# Patient Record
Sex: Male | Born: 1985 | Race: White | Hispanic: No | Marital: Single | Smoking: Current every day smoker
Health system: Southern US, Community
[De-identification: ages and names within clinical notes are randomized; demographics above are authoritative.]

## PROBLEM LIST (undated history)

## (undated) DIAGNOSIS — F32A Depression, unspecified: Secondary | ICD-10-CM

## (undated) DIAGNOSIS — F988 Other specified behavioral and emotional disorders with onset usually occurring in childhood and adolescence: Secondary | ICD-10-CM

## (undated) DIAGNOSIS — F1910 Other psychoactive substance abuse, uncomplicated: Secondary | ICD-10-CM

## (undated) DIAGNOSIS — F419 Anxiety disorder, unspecified: Secondary | ICD-10-CM

## (undated) DIAGNOSIS — T7840XA Allergy, unspecified, initial encounter: Secondary | ICD-10-CM

## (undated) DIAGNOSIS — G4730 Sleep apnea, unspecified: Secondary | ICD-10-CM

## (undated) DIAGNOSIS — I10 Essential (primary) hypertension: Secondary | ICD-10-CM

## (undated) HISTORY — DX: Essential (primary) hypertension: I10

## (undated) HISTORY — PX: WISDOM TOOTH EXTRACTION: SHX21

## (undated) HISTORY — DX: Other psychoactive substance abuse, uncomplicated: F19.10

## (undated) HISTORY — DX: Other specified behavioral and emotional disorders with onset usually occurring in childhood and adolescence: F98.8

## (undated) HISTORY — DX: Sleep apnea, unspecified: G47.30

## (undated) HISTORY — PX: TONSILLECTOMY: SUR1361

## (undated) HISTORY — DX: Anxiety disorder, unspecified: F41.9

## (undated) HISTORY — DX: Depression, unspecified: F32.A

## (undated) HISTORY — PX: TYMPANOSTOMY TUBE PLACEMENT: SHX32

## (undated) HISTORY — DX: Allergy, unspecified, initial encounter: T78.40XA

---
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
## 2012-08-05 DIAGNOSIS — F429 Obsessive-compulsive disorder, unspecified: Secondary | ICD-10-CM | POA: Insufficient documentation

## 2014-12-27 ENCOUNTER — Encounter (HOSPITAL_COMMUNITY): Payer: Self-pay | Admitting: Emergency Medicine

## 2014-12-27 ENCOUNTER — Emergency Department (HOSPITAL_COMMUNITY)
Admission: EM | Admit: 2014-12-27 | Discharge: 2014-12-27 | Disposition: A | Discharge status: Home / Self Care | Payer: 59 | Attending: Emergency Medicine | Admitting: Emergency Medicine

## 2014-12-27 DIAGNOSIS — Z720 Tobacco use: Secondary | ICD-10-CM | POA: Insufficient documentation

## 2014-12-27 DIAGNOSIS — K644 Residual hemorrhoidal skin tags: Secondary | ICD-10-CM | POA: Insufficient documentation

## 2014-12-27 DIAGNOSIS — K6289 Other specified diseases of anus and rectum: Secondary | ICD-10-CM | POA: Insufficient documentation

## 2014-12-27 DIAGNOSIS — Z880 Allergy status to penicillin: Secondary | ICD-10-CM | POA: Diagnosis not present

## 2014-12-27 DIAGNOSIS — N4889 Other specified disorders of penis: Secondary | ICD-10-CM | POA: Insufficient documentation

## 2014-12-27 DIAGNOSIS — K648 Other hemorrhoids: Secondary | ICD-10-CM | POA: Insufficient documentation

## 2014-12-27 LAB — URINE MICROSCOPIC-ADD ON

## 2014-12-27 LAB — URINALYSIS, ROUTINE W REFLEX MICROSCOPIC
Glucose, UA: NEGATIVE mg/dL
Hgb urine dipstick: NEGATIVE
KETONES UR: 15 mg/dL — AB
LEUKOCYTES UA: NEGATIVE
NITRITE: NEGATIVE
PH: 6 (ref 5.0–8.0)
Protein, ur: 30 mg/dL — AB
Specific Gravity, Urine: 1.039 — ABNORMAL HIGH (ref 1.005–1.030)
Urobilinogen, UA: 1 mg/dL (ref 0.0–1.0)

## 2014-12-27 LAB — POC OCCULT BLOOD, ED: FECAL OCCULT BLD: NEGATIVE

## 2014-12-27 MED ORDER — DIBUCAINE 1 % EX OINT: TOPICAL_OINTMENT | Freq: Three times a day (TID) | CUTANEOUS | Status: DC | PRN

## 2014-12-27 MED ORDER — DOCUSATE SODIUM 100 MG PO CAPS: 100.0000 mg | ORAL_CAPSULE | Freq: Two times a day (BID) | ORAL | Status: DC | PRN

## 2014-12-27 NOTE — ED Provider Notes (Signed)
CSN: [id number]     Arrival date & time 12/27/14  1642 History  This chart was scribed for non-physician practitioner, Trixie Dredge, PA working with Alvira Monday, MD by Gwenyth Ober, ED scribe. This patient was seen in room TR03C/TR03C and the patient's care was started at 5:24 PM   Chief Complaint  Patient presents with  . Rectal Pain   The history is provided by the patient. No language interpreter was used.   HPI Comments: Patrick Alexander is a 29 y.o. male with no chronic medical history who presents to the Emergency Department complaining of constant, gradually worsening rectal pain that radiates to his "prostate and urethra" and started 2 weeks ago. He states a visible external hemorrhoid, mild light-headedness secondary to pain, occasional blood in his stool, swelling of his inguinal lymph nodes and incomplete emptying of his bladder as associated symptoms. Pt describes recent BMs as hard and small, very painful to pass. He last saw blood in his stool 3-4 days ago. Pt's pain becomes worse with bowel movements and straining to urinate. He has tried 2 Ibuprofen, Preparation H and hemorrhoid suppositories PTA with no relief. Pt reports onset of pain started 1 week after he was doing repetitive, heavy lifting. He describes initial symptoms as discomfort during BM, but states pain has worsened and now radiates to his urethra. His external hemorrhoid has tripled in size within the last few days. Pt denies history of prostatitis. He is not currently sexually active, and states with his few sexual encounters over the years he has always used protection. Pt denies a history of abdominal or pelvic surgeries. He also denies fever, chills, generalized body aches, abdominal pain, testicular pain, testicular swelling and penile discharge as associated symptoms.  History reviewed. No pertinent past medical history. Past Surgical History  Procedure Laterality Date  . Tonsillectomy     No family history on  file. Social History  Substance Use Topics  . Smoking status: Current Every Day Smoker -- 0.50 packs/day    Types: Cigarettes  . Smokeless tobacco: None  . Alcohol Use: Yes     Comment: social    Review of Systems  Constitutional: Negative for fever and chills.  Gastrointestinal: Positive for blood in stool, anal bleeding and rectal pain. Negative for abdominal pain.  Genitourinary: Positive for difficulty urinating and penile pain. Negative for discharge, scrotal swelling and testicular pain.  Musculoskeletal: Negative for myalgias.  Allergic/Immunologic: Negative for immunocompromised state.  Neurological: Positive for light-headedness.  Hematological: Does not bruise/bleed easily.  All other systems reviewed and are negative.  Allergies  Penicillins  Home Medications   Prior to Admission medications   Not on File   BP 144/93 mmHg  Pulse 98  Temp(Src) 98 F (36.7 C) (Oral)  Resp 16  Wt 166 lb (75.297 kg)  SpO2 98% Physical Exam  Constitutional: He appears well-developed and well-nourished. No distress.  HENT:  Head: Normocephalic and atraumatic.  Neck: Neck supple.  Cardiovascular: Normal rate and regular rhythm.   Pulmonary/Chest: Effort normal and breath sounds normal. No respiratory distress. He has no wheezes. He has no rales.  Abdominal: Soft. He exhibits no distension and no mass. There is no tenderness. There is no rebound and no guarding.  Genitourinary:  Chaperone (scribe) was present for exam which was performed with no discomfort or complications.  Single external hemorrhoid, soft, non-tender at 6 o'clock Multiple internal hemorrhoids  Prostate non-tender No other masses noted No focal tenderness or induration  Neurological: He is alert.  He exhibits normal muscle tone.  Skin: He is not diaphoretic.  Nursing note and vitals reviewed.   ED Course  Procedures   DIAGNOSTIC STUDIES: Oxygen Saturation is 98% on RA, normal by my interpretation.     COORDINATION OF CARE: 5:48 PM Discussed treatment plan with pt at bedside and pt agreed to plan.  7:29 PM Discussed lab results with pt and suspicion for pain secondary to hemorrhoids. Advised pt to return with worsening symptoms including fever, abdominal pain, vomiting, scrotal pain and dysuria. Will prescribe stool softener and refer pt to a general surgeon. Pt agreed to plan.  Labs Review Labs Reviewed  URINALYSIS, ROUTINE W REFLEX MICROSCOPIC (NOT AT [facility]) - Abnormal; Notable for the following:    Color, Urine AMBER (*)    APPearance CLOUDY (*)    Specific Gravity, Urine 1.039 (*)    Bilirubin Urine SMALL (*)    Ketones, ur 15 (*)    Protein, ur 30 (*)    All other components within normal limits  URINE MICROSCOPIC-ADD ON - Abnormal; Notable for the following:    Squamous Epithelial / LPF FEW (*)    All other components within normal limits  POC OCCULT BLOOD, ED    Imaging Review No results found.    EKG Interpretation None       Discussed pt with Dr Dalene Seltzer.   MDM   Final diagnoses:  Anal or rectal pain  Internal hemorrhoids  External hemorrhoid    Afebrile, nontoxic patient with anal/rectal pain and new internal and external hemorrhoids.  The hemorrhoids are not thrombosed.  His stool is hemoccult negative.  He has no fever or systemic symptoms, no dysuria, no prostate tenderness, testicular pain or swelling.  Doubt prostatitis, epididymitis, UTI, perirectal abscess, proctitis.   D/C home with dibucaine ointment, colace, general surgery referral.  Discussed return precautions.  Discussed result, findings, treatment, and follow up  with patient.  Pt given return precautions.  Pt verbalizes understanding and agrees with plan.      I personally performed the services described in this documentation, which was scribed in my presence. The recorded information has been reviewed and is accurate.   Trixie Dredge, PA-C 12/27/14 2056  Alvira Monday, MD 12/31/14  2148

## 2014-12-27 NOTE — ED Notes (Signed)
Pt from home for eval of rectal pain x1 week, pt states recently has been doing  A lot of heavy lifting and has noticed pain when having a bowel movement. Pt denies hx of hemorrhoids  And has tried OTC meds with no relief. Pt states 2 episodes last week where he noticed blood on toilet tissue but no episodes since. nad noted. Denies any n/v/d.

## 2014-12-27 NOTE — Discharge Instructions (Signed)
Read the information below.  Use the prescribed medication as directed.  Please discuss all new medications with your pharmacist.  You may return to the Emergency Department at any time for worsening condition or any new symptoms that concern you.   If you develop fevers, uncontrolled pain, pain or burning with urination, testicular pain or swelling, uncontrolled bleeding, or vomiting, return to the ER immediately for a recheck.    Hemorrhoids Hemorrhoids are swollen veins around the rectum or anus. There are two types of hemorrhoids:   Internal hemorrhoids. These occur in the veins just inside the rectum. They may poke through to the outside and become irritated and painful.  External hemorrhoids. These occur in the veins outside the anus and can be felt as a painful swelling or hard lump near the anus. CAUSES  Pregnancy.   Obesity.   Constipation or diarrhea.   Straining to have a bowel movement.   Sitting for long periods on the toilet.  Heavy lifting or other activity that caused you to strain.  Anal intercourse. SYMPTOMS   Pain.   Anal itching or irritation.   Rectal bleeding.   Fecal leakage.   Anal swelling.   One or more lumps around the anus.  DIAGNOSIS  Your caregiver may be able to diagnose hemorrhoids by visual examination. Other examinations or tests that may be performed include:   Examination of the rectal area with a gloved hand (digital rectal exam).   Examination of anal canal using a small tube (scope).   A blood test if you have lost a significant amount of blood.  A test to look inside the colon (sigmoidoscopy or colonoscopy). TREATMENT Most hemorrhoids can be treated at home. However, if symptoms do not seem to be getting better or if you have a lot of rectal bleeding, your caregiver may perform a procedure to help make the hemorrhoids get smaller or remove them completely. Possible treatments include:   Placing a rubber band at the  base of the hemorrhoid to cut off the circulation (rubber band ligation).   Injecting a chemical to shrink the hemorrhoid (sclerotherapy).   Using a tool to burn the hemorrhoid (infrared light therapy).   Surgically removing the hemorrhoid (hemorrhoidectomy).   Stapling the hemorrhoid to block blood flow to the tissue (hemorrhoid stapling).  HOME CARE INSTRUCTIONS   Eat foods with fiber, such as whole grains, beans, nuts, fruits, and vegetables. Ask your doctor about taking products with added fiber in them (fibersupplements).  Increase fluid intake. Drink enough water and fluids to keep your urine clear or pale yellow.   Exercise regularly.   Go to the bathroom when you have the urge to have a bowel movement. Do not wait.   Avoid straining to have bowel movements.   Keep the anal area dry and clean. Use wet toilet paper or moist towelettes after a bowel movement.   Medicated creams and suppositories may be used or applied as directed.   Only take over-the-counter or prescription medicines as directed by your caregiver.   Take warm sitz baths for 15-20 minutes, 3-4 times a day to ease pain and discomfort.   Place ice packs on the hemorrhoids if they are tender and swollen. Using ice packs between sitz baths may be helpful.   Put ice in a plastic bag.   Place a towel between your skin and the bag.   Leave the ice on for 15-20 minutes, 3-4 times a day.   Do not use a  donut-shaped pillow or sit on the toilet for long periods. This increases blood pooling and pain.  SEEK MEDICAL CARE IF:  You have increasing pain and swelling that is not controlled by treatment or medicine.  You have uncontrolled bleeding.  You have difficulty or you are unable to have a bowel movement.  You have pain or inflammation outside the area of the hemorrhoids. MAKE SURE YOU:  Understand these instructions.  Will watch your condition.  Will get help right away if you are  not doing well or get worse. Document Released: 04/02/2000 Document Revised: 03/22/2012 Document Reviewed: 02/08/2012 [facility] Patient Information 2015 [place], [place]. This information is not intended to replace advice given to you by your health care provider. Make sure you discuss any questions you have with your health care provider.

## 2015-02-03 ENCOUNTER — Ambulatory Visit (INDEPENDENT_AMBULATORY_CARE_PROVIDER_SITE_OTHER): Payer: 59 | Admitting: Family Medicine

## 2015-02-03 ENCOUNTER — Encounter: Payer: Self-pay | Admitting: Family Medicine

## 2015-02-03 VITALS — BP 140/90 | HR 68 | Ht 67.0 in | Wt 166.2 lb

## 2015-02-03 DIAGNOSIS — J019 Acute sinusitis, unspecified: Secondary | ICD-10-CM | POA: Diagnosis not present

## 2015-02-03 DIAGNOSIS — R05 Cough: Secondary | ICD-10-CM | POA: Diagnosis not present

## 2015-02-03 DIAGNOSIS — Z7189 Other specified counseling: Secondary | ICD-10-CM

## 2015-02-03 DIAGNOSIS — K649 Unspecified hemorrhoids: Secondary | ICD-10-CM | POA: Insufficient documentation

## 2015-02-03 DIAGNOSIS — Z7251 High risk heterosexual behavior: Secondary | ICD-10-CM | POA: Insufficient documentation

## 2015-02-03 DIAGNOSIS — Z7689 Persons encountering health services in other specified circumstances: Secondary | ICD-10-CM

## 2015-02-03 DIAGNOSIS — Z87891 Personal history of nicotine dependence: Secondary | ICD-10-CM

## 2015-02-03 DIAGNOSIS — R059 Cough, unspecified: Secondary | ICD-10-CM

## 2015-02-03 HISTORY — DX: Unspecified hemorrhoids: K64.9

## 2015-02-03 HISTORY — DX: High risk heterosexual behavior: Z72.51

## 2015-02-03 MED ORDER — BENZONATATE 200 MG PO CAPS
200.0000 mg | ORAL_CAPSULE | Freq: Two times a day (BID) | ORAL | Status: DC | PRN
Start: 2015-02-03 — End: 2015-03-18

## 2015-02-03 MED ORDER — AZITHROMYCIN 250 MG PO TABS: ORAL_TABLET | ORAL | Status: DC

## 2015-02-03 NOTE — Progress Notes (Signed)
   Subjective:    Patient ID: Patrick Alexander, male    DOB: 01/26/1986, 29 y.o.   MRN: [id number]  HPI He is new to the practice and here to establish primary care. Complains of dry cough for 7 days that turned into a productive cough today. Also complains of sinus pressure, left ear ache and significant drainage,  body aches, and  3-4 nights ago he states he had difficulty breathing. States he took ibuprofen 800mg  and hot shower and got relief. Denies fever, chills, nausea, vomiting. Has tried Mucinex and Tylenol cold and flu. Reports feeling worse. Quit smoking last week. No known sick contacts. States he thinks he has some underlying allergies also.  Alcohol use 1-2 per week. Denies drug use.   Tonsillectomy in past.  History of bronchitis. Also states he was diagnosed with ADD in past and took Adderall for about a year or so but no longer needs it. States he is able to focus better since moving here. He lived in [place] previously.  States he is in a same sex relationship.  Works at [employer], is also IT trainerlicensed cosmetologist  Does not want flu shot.  Reviewed allergies, medications, past medical, surgical, family and social history.   Review of Systems Pertinent positives and negatives in HPI.     Objective:   Physical Exam Alert and in no distress. Left maxillary sinus tenderness. Tympanic membranes and canals are normal. Pharyngeal area is normal. Neck is supple without adenopathy but tenderness to tonsillar lymph nodes. Cardiac exam shows a regular rate and rhythm without murmurs or gallops. Lungs are clear to auscultation.    BP 140/90 mmHg  Pulse 68  Ht 5\' 7"  (1.702 m)  Wt 166 lb 3.2 oz (75.388 kg)  BMI 26.02 kg/m2     Assessment & Plan:  Cough - Plan: benzonatate (TESSALON) 200 MG capsule, azithromycin (ZITHROMAX Z-PAK) 250 MG tablet  Acute rhinosinusitis - Plan: azithromycin (ZITHROMAX Z-PAK) 250 MG tablet  Encounter to establish care  Former smoker  Discussed  symptomatic treatment and antibiotic therapy. Samples of Zyrtec given. Recommend staying hydrated and taking ibuprofen or tylenol for body aches. He will let me know if not back to baseline after completing antibiotic. Congratulated him on stopping smoking.  Refused flu shot.

## 2015-02-28 ENCOUNTER — Ambulatory Visit (INDEPENDENT_AMBULATORY_CARE_PROVIDER_SITE_OTHER): Payer: 59 | Admitting: Family Medicine

## 2015-02-28 ENCOUNTER — Encounter: Payer: Self-pay | Admitting: Family Medicine

## 2015-02-28 VITALS — BP 110/54 | HR 90 | Wt 161.0 lb

## 2015-02-28 DIAGNOSIS — R221 Localized swelling, mass and lump, neck: Secondary | ICD-10-CM

## 2015-02-28 DIAGNOSIS — Z720 Tobacco use: Secondary | ICD-10-CM

## 2015-02-28 DIAGNOSIS — L42 Pityriasis rosea: Secondary | ICD-10-CM | POA: Diagnosis not present

## 2015-02-28 DIAGNOSIS — F17200 Nicotine dependence, unspecified, uncomplicated: Secondary | ICD-10-CM

## 2015-02-28 NOTE — Patient Instructions (Signed)
  You can take Benadryl if itching from rash. It will go away in a few weeks. Not contagious and nothing to worry about.   Pityriasis Rosea Pityriasis rosea is a rash that usually appears on the trunk of the body. It may also appear on the upper arms and upper legs. It usually begins as a single patch, and then more patches begin to develop. The rash may cause mild itching, but it normally does not cause other problems. It usually goes away without treatment. However, it may take weeks or months for the rash to go away completely. CAUSES The cause of this condition is not known. The condition does not spread from person to person (is noncontagious). RISK FACTORS This condition is more likely to develop in young adults and children. It is most common in the spring and fall. SYMPTOMS The main symptom of this condition is a rash.  The rash usually begins with a single oval patch that is larger than the ones that follow. This is called a herald patch. It generally appears a week or more before the rest of the rash appears.  When more patches start to develop, they spread quickly on the trunk, back, and arms. These patches are smaller than the first one.  The patches that make up the rash are usually oval-shaped and pink or red in color. They are usually flat, but they may sometimes be raised so that they can be felt with a finger. They may also be finely crinkled and have a scaly ring around the edge.  The rash does not typically appear on areas of the skin that are exposed to the sun. Most people who have this condition do not have other symptoms, but some have mild itching. In a few cases, a mild headache or body aches may occur before the rash appears and then go away. DIAGNOSIS Your health care provider may diagnose this condition by doing a physical exam and taking your medical history. To rule out other possible causes for the rash, the health care provider may order blood tests or take a skin  sample from the rash to be looked at under a microscope. TREATMENT Usually, treatment is not needed for this condition. The rash will probably go away on its own in 4-8 weeks. In some cases, a health care provider may recommend or prescribe medicine to reduce itching. HOME CARE INSTRUCTIONS  Take medicines only as directed by your health care provider.  Avoid scratching the affected areas of skin.  Do not take hot baths or use a sauna. Use only warm water when bathing or showering. Heat can increase itching. SEEK MEDICAL CARE IF:  Your rash does not go away in 8 weeks.  Your rash gets much worse.  You have a fever.  You have swelling or pain in the rash area.  You have fluid, blood, or pus coming from the rash area.   This information is not intended to replace advice given to you by your health care provider. Make sure you discuss any questions you have with your health care provider.   Document Released: 05/12/2001 Document Revised: 08/20/2014 Document Reviewed: 03/13/2014 Elsevier Interactive Patient Education [employer]2016 [employer].

## 2015-02-28 NOTE — Progress Notes (Signed)
   Subjective:    Patient ID: Patrick CousinsEvan Breden, male    DOB: 04/22/1985, 29 y.o.   MRN: [id number]  HPI Chief Complaint  Patient presents with  . allergic reactions    talked to dr Susann Givenslalonde and was having neuropathy type symptoms. said it stopped but a day after finishing zpack bumps appeared on stomach and chest, also said he is sleeping 10 or more hours a night. 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 the bumps have spread to arms and legs in the past two days. started with the z pack and it helped his cold symptoms but he is having all these side effects.     He is here with complaints of a rash to torso for approximately 3 weeks. States rash spread to his extremities 2-3 days ago. Denies itching. Denies fever, chills, nausea, vomiting, diarrhea States he took a Z-pak about a month ago and on the 3rd day he started having bilateral feet tingling and bottom of feet burning. States this sensation resolved.  States he finished he Z-pak approximately 3 weeks ago and then developed a rash to his torso. Reports having a cyst-like nodule to left neck, above hair line.    Has been sleeping more than usual, has felt tired. Working 3 jobs.  Started smoking again due to stress.   Denies fever, chills, unexplained weight loss, cough.    Review of Systems Pertinent positives and negatives in the history of present illness.    Objective:   Physical Exam  Diffuse pinkish-red small patches to torso and extremities, no inflammation.  1 cm round smooth nodule to left posterior head just above the hairline and at the base of the skull.  Round smooth moveable nodule to right supraclavicular area.  BP 110/54 mmHg  Pulse 90  Wt 161 lb (73.029 kg)  SpO2 98%     Assessment & Plan:  Pityriasis rosea  Nodule of neck - Plan: DG Chest 2 View  Smoker  Recommend that he stop smoking, encouraged him to do so since he had stopped for a while.  Discussed that his rash appears to be a virus and is not contagious  and that it should go away within 6 weeks. He can use Benadryl for itching if needed. Reassured him that the cyst to posterior head is nothing to worry about and the fact that this has been present for a couple of years speaks to that. Susect possible cyst to right supraclavicular but cannot rule out an enlarged lymph node. Will send him for chest x-ray. Dr. Susann GivensLalonde also examined this patient.

## 2015-03-17 ENCOUNTER — Telehealth: Payer: Self-pay

## 2015-03-17 NOTE — Telephone Encounter (Signed)
Pt is going to be seen tomorrow morning with vickie cause he wanted to get in sooner than later

## 2015-03-17 NOTE — Telephone Encounter (Signed)
Please call and find out more information. Why is he interested in finding a colorectal surgeon first of all? Is he having rectal or colon symptoms? Does he have a history of problems with this?

## 2015-03-17 NOTE — Telephone Encounter (Signed)
In July he did a lot of yard work that he thinks sparked hemorrhoids. 3-4 months ago the pain was too much so he went to ER and they did a rectal exam. There was multiple internal hemorrhoids that they said needed to be worked on, but Visual merchandiservan said he didn't want to do anything about them just yet. He says he has blood in his stool and the way he is feeling is taking energy.

## 2015-03-17 NOTE — Telephone Encounter (Signed)
He called wanting to know if you could suggest a colorectal surgeon. Just wanting to know if you or the other providers know of any.

## 2015-03-17 NOTE — Telephone Encounter (Signed)
Please let him know that he needs to schedule an appointment with either me or Dr. Susann GivensLalonde about the rectal bleeding and hemorrhoids so we can evaluate him and refer him if needed. We can refer him but have to examine and have proper documentation in order to do this.

## 2015-03-18 ENCOUNTER — Ambulatory Visit (INDEPENDENT_AMBULATORY_CARE_PROVIDER_SITE_OTHER): Payer: 59 | Admitting: Family Medicine

## 2015-03-18 ENCOUNTER — Encounter: Payer: Self-pay | Admitting: Family Medicine

## 2015-03-18 VITALS — BP 128/88 | HR 88 | Wt 158.0 lb

## 2015-03-18 DIAGNOSIS — K629 Disease of anus and rectum, unspecified: Secondary | ICD-10-CM | POA: Diagnosis not present

## 2015-03-18 DIAGNOSIS — R300 Dysuria: Secondary | ICD-10-CM

## 2015-03-18 DIAGNOSIS — K6289 Other specified diseases of anus and rectum: Secondary | ICD-10-CM

## 2015-03-18 DIAGNOSIS — K602 Anal fissure, unspecified: Secondary | ICD-10-CM

## 2015-03-18 DIAGNOSIS — K644 Residual hemorrhoidal skin tags: Secondary | ICD-10-CM

## 2015-03-18 DIAGNOSIS — K648 Other hemorrhoids: Secondary | ICD-10-CM

## 2015-03-18 LAB — POCT URINALYSIS DIPSTICK
BILIRUBIN UA: NEGATIVE
Glucose, UA: NEGATIVE
Ketones, UA: NEGATIVE
Leukocytes, UA: NEGATIVE
NITRITE UA: NEGATIVE
PH UA: 6
PROTEIN UA: NEGATIVE
RBC UA: NEGATIVE
Spec Grav, UA: >=1.03
Urobilinogen, UA: NEGATIVE

## 2015-03-18 NOTE — Patient Instructions (Addendum)
Stay well hydrated, your urine indicates that you are not currently well hydrated. Keep her stools soft by adding bulk to your diet, drinking plenty of fluids, exercise. He may also take a stool softener if you would like. You can try sitz baths and showers but do not scrub the area. You can use over-the-counter antibiotic ointment or cream for the next 2 weeks and let us know if no improvement.  Anal Fissure, Adult An anal fissure is a small tear or crack in the skin around the anus. Bleeding from a fissure usually stops on its own within a few minutes. However, bleeding will often occur again with each bowel movement until the crack heals. CAUSES This condition may be caused by:  Passing large, hard stool (feces).  Frequent diarrhea.  Constipation.  Inflammatory bowel disease (Crohn disease or ulcerative colitis).  Infections.  Anal sex. SYMPTOMS Symptoms of this condition include:  Bleeding from the rectum.  Small amounts of blood seen on your stool, on toilet paper, or in the toilet after a bowel movement.  Painful bowel movements.  Itching or irritation around the anus. DIAGNOSIS A health care provider may diagnose this condition by closely examining the anal area. An anal fissure can usually be seen with careful inspection. In some cases, a rectal exam may be performed, or a short tube (anoscope) may be used to examine the anal canal. TREATMENT Treatment for this condition may include:  Taking steps to avoid constipation. This may include making changes to your diet, such as increasing your intake of fiber or fluid.  Taking fiber supplements. These supplements can soften your stool to help make bowel movements easier. Your health care provider may also prescribe a stool softener if your stool is often hard.  Taking sitz baths. This may help to heal the tear.  Using medicated creams or ointments. These may be prescribed to lessen discomfort. HOME CARE INSTRUCTIONS Eating  and Drinking  Avoid foods that may be constipating, such as bananas and dairy products.  Drink enough fluid to keep your urine clear or pale yellow.  Maintain a diet that is high in fruits, whole grains, and vegetables. General Instructions  Keep the anal area as clean and dry as possible.  Take sitz baths as told by your health care provider. Do not use soap in the sitz baths.  Take over-the-counter and prescription medicines only as told by your health care provider.  Use creams or ointments only as told by your health care provider.  Keep all follow-up visits as told by your health care provider. This is important. SEEK MEDICAL CARE IF:  You have more bleeding.  You have a fever.  You have diarrhea that is mixed with blood.  You continue to have pain.  Your problem is getting worse rather than better.   This information is not intended to replace advice given to you by your health care provider. Make sure you discuss any questions you have with your health care provider.   Document Released: 04/05/2005 Document Revised: 12/25/2014 Document Reviewed: 07/01/2014 Elsevier Interactive Patient Education 2016 [employer]. Hemorrhoids Hemorrhoids are swollen veins around the rectum or anus. There are two types of hemorrhoids:   Internal hemorrhoids. These occur in the veins just inside the rectum. They may poke through to the outside and become irritated and painful.  External hemorrhoids. These occur in the veins outside the anus and can be felt as a painful swelling or hard lump near the anus. CAUSES  Pregnancy.  Obesity.   Constipation or diarrhea.   Straining to have a bowel movement.   Sitting for long periods on the toilet.  Heavy lifting or other activity that caused you to strain.  Anal intercourse. SYMPTOMS   Pain.   Anal itching or irritation.   Rectal bleeding.   Fecal leakage.   Anal swelling.   One or more lumps around the anus.   DIAGNOSIS  Your caregiver may be able to diagnose hemorrhoids by visual examination. Other examinations or tests that may be performed include:   Examination of the rectal area with a gloved hand (digital rectal exam).   Examination of anal canal using a small tube (scope).   A blood test if you have lost a significant amount of blood.  A test to look inside the colon (sigmoidoscopy or colonoscopy). TREATMENT Most hemorrhoids can be treated at home. However, if symptoms do not seem to be getting better or if you have a lot of rectal bleeding, your caregiver may perform a procedure to help make the hemorrhoids get smaller or remove them completely. Possible treatments include:   Placing a rubber band at the base of the hemorrhoid to cut off the circulation (rubber band ligation).   Injecting a chemical to shrink the hemorrhoid (sclerotherapy).   Using a tool to burn the hemorrhoid (infrared light therapy).   Surgically removing the hemorrhoid (hemorrhoidectomy).   Stapling the hemorrhoid to block blood flow to the tissue (hemorrhoid stapling).  HOME CARE INSTRUCTIONS   Eat foods with fiber, such as whole grains, beans, nuts, fruits, and vegetables. Ask your doctor about taking products with added fiber in them (fibersupplements).  Increase fluid intake. Drink enough water and fluids to keep your urine clear or pale yellow.   Exercise regularly.   Go to the bathroom when you have the urge to have a bowel movement. Do not wait.   Avoid straining to have bowel movements.   Keep the anal area dry and clean. Use wet toilet paper or moist towelettes after a bowel movement.   Medicated creams and suppositories may be used or applied as directed.   Only take over-the-counter or prescription medicines as directed by your caregiver.   Take warm sitz baths for 15-20 minutes, 3-4 times a day to ease pain and discomfort.   Place ice packs on the hemorrhoids if they are  tender and swollen. Using ice packs between sitz baths may be helpful.   Put ice in a plastic bag.   Place a towel between your skin and the bag.   Leave the ice on for 15-20 minutes, 3-4 times a day.   Do not use a donut-shaped pillow or sit on the toilet for long periods. This increases blood pooling and pain.  SEEK MEDICAL CARE IF:  You have increasing pain and swelling that is not controlled by treatment or medicine.  You have uncontrolled bleeding.  You have difficulty or you are unable to have a bowel movement.  You have pain or inflammation outside the area of the hemorrhoids. MAKE SURE YOU:  Understand these instructions.  Will watch your condition.  Will get help right away if you are not doing well or get worse.   This information is not intended to replace advice given to you by your health care provider. Make sure you discuss any questions you have with your health care provider.   Document Released: 04/02/2000 Document Revised: 03/22/2012 Document Reviewed: 02/08/2012 Elsevier Interactive Patient Education [employer]2016 Elsevier Inc.

## 2015-03-18 NOTE — Progress Notes (Signed)
   Subjective:    Patient ID: Patrick Alexander, male    DOB: 11/22/1985, 29 y.o.   MRN: [id number]  HPI Chief Complaint  Patient presents with  . rectal bleeding    rectal bleeding and hemorrhoids- bleeding everytime he goes to the bathroom. would like a referral   He is here with complaints of rectal pain and small amount of bright red blood with bowel movements and "film-like" around blood. Reports having an external hemorrhoid feels "ripping" with bowel movement. 2 months ago he went to ED for rectal pain and was told he had internal hemorrhoids and an external hemorrhoid. Also reports dysuria since yesterday that he describes as "hot water" going through his urethra.  Reports stool varies in shape and occasionally are "narrow". He denies sexual activity for past 5 months.  Denies fever, chills, nausea, vomiting, abdominal or back pain, incontinence.   Reviewed allergies, medications, past medical and social history.  Review of Systems Pertinent positives and negatives in the history of present illness.    Objective:   Physical Exam  .5 cm sentinal node at 12 o'clock position with fissure noted along hemorrhoid.       Assessment & Plan:  Rectal pain  Dysuria - Plan: POCT urinalysis dipstick, GC/chlamydia probe amp, urine  External hemorrhoid  Rectal fissure  Discussed keeping his stools soft by using stool softener, diet high in fiber, hydration and exercise. Recommend sitz baths or showers and not scrubbing the area. He may try an antibiotic cream for the next 2 weeks and will let me know if no improvement. We'll consider using diltiazem 2% cream at that time per Dr. Susann GivensLalonde. Dr. Susann GivensLalonde also examined patient in conjunction with this provider. Urine negative for infection or blood but does show he is not hydrated, discussed this with patient. He would like to hold off for now on taking antibiotic for possible urethritis and see what his STI testing shows.

## 2015-03-19 LAB — GC/CHLAMYDIA PROBE AMP, URINE
Chlamydia, Swab/Urine, PCR: NEGATIVE
GC Probe Amp, Urine: NEGATIVE

## 2015-04-23 ENCOUNTER — Telehealth: Payer: Self-pay

## 2015-04-23 ENCOUNTER — Ambulatory Visit (INDEPENDENT_AMBULATORY_CARE_PROVIDER_SITE_OTHER): Payer: BLUE CROSS/BLUE SHIELD | Admitting: Family Medicine

## 2015-04-23 ENCOUNTER — Ambulatory Visit
Admission: RE | Admit: 2015-04-23 | Discharge: 2015-04-23 | Disposition: A | Discharge status: Home / Self Care | Payer: BLUE CROSS/BLUE SHIELD | Source: Ambulatory Visit | Attending: Family Medicine | Admitting: Family Medicine

## 2015-04-23 ENCOUNTER — Encounter: Payer: Self-pay | Admitting: Family Medicine

## 2015-04-23 ENCOUNTER — Other Ambulatory Visit: Payer: Self-pay | Admitting: Family Medicine

## 2015-04-23 VITALS — BP 120/80 | HR 64 | Ht 67.25 in | Wt 161.4 lb

## 2015-04-23 DIAGNOSIS — Z0000 Encounter for general adult medical examination without abnormal findings: Secondary | ICD-10-CM | POA: Diagnosis not present

## 2015-04-23 DIAGNOSIS — Z720 Tobacco use: Secondary | ICD-10-CM

## 2015-04-23 DIAGNOSIS — R590 Localized enlarged lymph nodes: Secondary | ICD-10-CM

## 2015-04-23 DIAGNOSIS — F17200 Nicotine dependence, unspecified, uncomplicated: Secondary | ICD-10-CM

## 2015-04-23 DIAGNOSIS — Z8349 Family history of other endocrine, nutritional and metabolic diseases: Secondary | ICD-10-CM | POA: Diagnosis not present

## 2015-04-23 DIAGNOSIS — Z8659 Personal history of other mental and behavioral disorders: Secondary | ICD-10-CM | POA: Diagnosis not present

## 2015-04-23 DIAGNOSIS — Z23 Encounter for immunization: Secondary | ICD-10-CM

## 2015-04-23 DIAGNOSIS — Z83438 Family history of other disorder of lipoprotein metabolism and other lipidemia: Secondary | ICD-10-CM

## 2015-04-23 DIAGNOSIS — Z7251 High risk heterosexual behavior: Secondary | ICD-10-CM

## 2015-04-23 DIAGNOSIS — R599 Enlarged lymph nodes, unspecified: Secondary | ICD-10-CM | POA: Diagnosis not present

## 2015-04-23 DIAGNOSIS — R9389 Abnormal findings on diagnostic imaging of other specified body structures: Secondary | ICD-10-CM

## 2015-04-23 LAB — POCT URINALYSIS DIPSTICK
BILIRUBIN UA: NEGATIVE
Blood, UA: NEGATIVE
Glucose, UA: NEGATIVE
Ketones, UA: NEGATIVE
LEUKOCYTES UA: NEGATIVE
NITRITE UA: NEGATIVE
PH UA: 6
PROTEIN UA: NEGATIVE
Spec Grav, UA: >=1.03
Urobilinogen, UA: NEGATIVE

## 2015-04-23 LAB — CBC WITH DIFFERENTIAL/PLATELET
BASOS ABS: 0.1 10*3/uL (ref 0.0–0.1)
Basophils Relative: 1 % (ref 0–1)
EOS ABS: 0.2 10*3/uL (ref 0.0–0.7)
EOS PCT: 4 % (ref 0–5)
HEMATOCRIT: 42.6 % (ref 39.0–52.0)
Hemoglobin: 14.1 g/dL (ref 13.0–17.0)
LYMPHS ABS: 2 10*3/uL (ref 0.7–4.0)
LYMPHS PCT: 32 % (ref 12–46)
MCH: 29.4 pg (ref 26.0–34.0)
MCHC: 33.1 g/dL (ref 30.0–36.0)
MCV: 88.8 fL (ref 78.0–100.0)
MONO ABS: 0.5 10*3/uL (ref 0.1–1.0)
MONOS PCT: 8 % (ref 3–12)
MPV: 8.6 fL (ref 8.6–12.4)
Neutro Abs: 3.4 10*3/uL (ref 1.7–7.7)
Neutrophils Relative %: 55 % (ref 43–77)
PLATELETS: 289 10*3/uL (ref 150–400)
RBC: 4.8 MIL/uL (ref 4.22–5.81)
RDW: 14 % (ref 11.5–15.5)
WBC: 6.1 10*3/uL (ref 4.0–10.5)

## 2015-04-23 LAB — COMPREHENSIVE METABOLIC PANEL
ALK PHOS: 73 U/L (ref 40–115)
ALT: 12 U/L (ref 9–46)
AST: 16 U/L (ref 10–40)
Albumin: 4.3 g/dL (ref 3.6–5.1)
BUN: 17 mg/dL (ref 7–25)
CALCIUM: 9.5 mg/dL (ref 8.6–10.3)
CHLORIDE: 105 mmol/L (ref 98–110)
CO2: 29 mmol/L (ref 20–31)
Creat: 0.88 mg/dL (ref 0.60–1.35)
GLUCOSE: 96 mg/dL (ref 65–99)
POTASSIUM: 4.7 mmol/L (ref 3.5–5.3)
Sodium: 140 mmol/L (ref 135–146)
Total Bilirubin: 0.2 mg/dL (ref 0.2–1.2)
Total Protein: 6.9 g/dL (ref 6.1–8.1)

## 2015-04-23 LAB — LIPID PANEL
CHOL/HDL RATIO: 4.3 ratio (ref <=5.0)
Cholesterol: 177 mg/dL (ref 125–200)
HDL: 41 mg/dL (ref >=40)
LDL Cholesterol: 120 mg/dL (ref <130)
TRIGLYCERIDES: 78 mg/dL (ref <150)
VLDL: 16 mg/dL (ref <30)

## 2015-04-23 NOTE — Telephone Encounter (Signed)
Pt called saying not to worry about signing a release form. He is going to go another therapist in town to be medicated.

## 2015-04-23 NOTE — Addendum Note (Signed)
Addended by: Herminio CommonsJOHNSON, SABRINA A on: 04/23/2015 03:38 PM   Modules accepted: Orders

## 2015-04-23 NOTE — Patient Instructions (Addendum)
We will call you with your lab and Xray results. Schedule appointment with counselor and dentist.  You received your flu shot today.  Try to cut back on smoking.  Recommend 150 minutes of physical activity per week.  Return in 2 weeks for follow up of lymph node.   Preventative Care for Adults, Male       REGULAR HEALTH EXAMS:  A routine yearly physical is a good way to check in with your primary care provider about your health and preventive screening. It is also an opportunity to share updates about your health and any concerns you have, and receive a thorough all-over exam.   Most health insurance companies pay for at least some preventative services.  Check with your health plan for specific coverages.  WHAT PREVENTATIVE SERVICES DO MEN NEED?  Adult men should have their weight and blood pressure checked regularly.   Men age 38 and older should have their cholesterol levels checked regularly.  Beginning at age 8 and continuing to age 14, men should be screened for colorectal cancer.  Certain people should may need continued testing until age 26.  Other cancer screening may include exams for testicular and prostate cancer.  Updating vaccinations is part of preventative care.  Vaccinations help protect against diseases such as the flu.  Lab tests are generally done as part of preventative care to screen for anemia and blood disorders, to screen for problems with the kidneys and liver, to screen for bladder problems, to check blood sugar, and to check your cholesterol level.  Preventative services generally include counseling about diet, exercise, avoiding tobacco, drugs, excessive alcohol consumption, and sexually transmitted infections.    GENERAL RECOMMENDATIONS FOR GOOD HEALTH:  Healthy diet:  Eat a variety of foods, including fruit, vegetables, animal or vegetable protein, such as meat, fish, chicken, and eggs, or beans, lentils, tofu, and grains, such as rice.  Drink  plenty of water daily.  Decrease saturated fat in the diet, avoid lots of red meat, processed foods, sweets, fast foods, and fried foods.  Exercise:  Aerobic exercise helps maintain good heart health. At least 30-40 minutes of moderate-intensity exercise is recommended. For example, a brisk walk that increases your heart rate and breathing. This should be done on most days of the week.   Find a type of exercise or a variety of exercises that you enjoy so that it becomes a part of your daily life.  Examples are running, walking, swimming, water aerobics, and biking.  For motivation and support, explore group exercise such as aerobic class, spin class, Zumba, Yoga,or  martial arts, etc.    Set exercise goals for yourself, such as a certain weight goal, walk or run in a race such as a 5k walk/run.  Speak to your primary care provider about exercise goals.  Disease prevention:  If you smoke or chew tobacco, find out from your caregiver how to quit. It can literally save your life, no matter how long you have been a tobacco user. If you do not use tobacco, never begin.   Maintain a healthy diet and normal weight. Increased weight leads to problems with blood pressure and diabetes.   The Body Mass Index or BMI is a way of measuring how much of your body is fat. Having a BMI above 27 increases the risk of heart disease, diabetes, hypertension, stroke and other problems related to obesity. Your caregiver can help determine your BMI and based on it develop an exercise and dietary  program to help you achieve or maintain this important measurement at a healthful level.  High blood pressure causes heart and blood vessel problems.  Persistent high blood pressure should be treated with medicine if weight loss and exercise do not work.   Fat and cholesterol leaves deposits in your arteries that can block them. This causes heart disease and vessel disease elsewhere in your body.  If your cholesterol is found  to be high, or if you have heart disease or certain other medical conditions, then you may need to have your cholesterol monitored frequently and be treated with medication.   Ask if you should have a stress test if your history suggests this. A stress test is a test done on a treadmill that looks for heart disease. This test can find disease prior to there being a problem.  Avoid drinking alcohol in excess (more than two drinks per day).  Avoid use of street drugs. Do not share needles with anyone. Ask for professional help if you need assistance or instructions on stopping the use of alcohol, cigarettes, and/or drugs.  Brush your teeth twice a day with fluoride toothpaste, and floss once a day. Good oral hygiene prevents tooth decay and gum disease. The problems can be painful, unattractive, and can cause other health problems. Visit your dentist for a routine oral and dental check up and preventive care every 6-12 months.   Look at your skin regularly.  Use a mirror to look at your back. Notify your caregivers of changes in moles, especially if there are changes in shapes, colors, a size larger than a pencil eraser, an irregular border, or development of new moles.  Safety:  Use seatbelts 100% of the time, whether driving or as a passenger.  Use safety devices such as hearing protection if you work in environments with loud noise or significant background noise.  Use safety glasses when doing any work that could send debris in to the eyes.  Use a helmet if you ride a bike or motorcycle.  Use appropriate safety gear for contact sports.  Talk to your caregiver about gun safety.  Use sunscreen with a SPF (or skin protection factor) of 15 or greater.  Lighter skinned people are at a greater risk of skin cancer. Don't forget to also wear sunglasses in order to protect your eyes from too much damaging sunlight. Damaging sunlight can accelerate cataract formation.   Practice safe sex. Use condoms.  Condoms are used for birth control and to help reduce the spread of sexually transmitted infections (or STIs).  Some of the STIs are gonorrhea (the clap), chlamydia, syphilis, trichomonas, herpes, HPV (human papilloma virus) and HIV (human immunodeficiency virus) which causes AIDS. The herpes, HIV and HPV are viral illnesses that have no cure. These can result in disability, cancer and death.   Keep carbon monoxide and smoke detectors in your home functioning at all times. Change the batteries every 6 months or use a model that plugs into the wall.   Vaccinations:  Stay up to date with your tetanus shots and other required immunizations. You should have a booster for tetanus every 10 years. Be sure to get your flu shot every year, since 5%-20% of the [place] population comes down with the flu. The flu vaccine changes each year, so being vaccinated once is not enough. Get your shot in the fall, before the flu season peaks.   Other vaccines to consider:  Pneumococcal vaccine to protect against certain types of pneumonia.  This is normally recommended for adults age 28 or older.  However, adults younger than 28 years old with certain underlying conditions such as diabetes, heart or lung disease should also receive the vaccine.  Shingles vaccine to protect against Varicella Zoster if you are older than age 28, or younger than 28 years old with certain underlying illness.  Hepatitis A vaccine to protect against a form of infection of the liver by a virus acquired from food.  Hepatitis B vaccine to protect against a form of infection of the liver by a virus acquired from blood or body fluids, particularly if you work in health care.  If you plan to travel internationally, check with your local health department for specific vaccination recommendations.  Cancer Screening:  Most routine colon cancer screening begins at the age of 28. On a yearly basis, doctors may provide special easy to use take-home  tests to check for hidden blood in the stool. Sigmoidoscopy or colonoscopy can detect the earliest forms of colon cancer and is life saving. These tests use a small camera at the end of a tube to directly examine the colon. Speak to your caregiver about this at age 28, when routine screening begins (and is repeated every 5 years unless early forms of pre-cancerous polyps or small growths are found).   At the age of 28 men usually start screening for prostate cancer every year. Screening may begin at a younger age for those with higher risk. Those at higher risk include African-Americans or having a family history of prostate cancer. There are two types of tests for prostate cancer:   Prostate-specific antigen (PSA) testing. Recent studies raise questions about prostate cancer using PSA and you should discuss this with your caregiver.   Digital rectal exam (in which your doctor's lubricated and gloved finger feels for enlargement of the prostate through the anus).   Screening for testicular cancer.  Do a monthly exam of your testicles. Gently roll each testicle between your thumb and fingers, feeling for any abnormal lumps. The best time to do this is after a hot shower or bath when the tissues are looser. Notify your caregivers of any lumps, tenderness or changes in size or shape immediately.

## 2015-04-23 NOTE — Progress Notes (Signed)
Subjective:    Patient ID: Patrick Alexander, male    DOB: 1986/02/06, 30 y.o.   MRN: [id number]  HPI Chief Complaint  Patient presents with  . fasting physical    fasting cpe, discuss adderall   He is here for a complete physical exam. He would also like to discuss ADD and ADD medication.  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 Has not seen counselor in past 7 months because he moved from [place] to [place].  States he was diagnosed at age 51 or 86 with ADD and took SSRIs for a number of years for this. States he was started on Adderall last year 30 mg XR and did not want to take that high of a dose. He states he felt like he had "fire running through his veins" on 30 mg. He states 10 mg or 20 mg worked best for him in past.  He states he is having difficulty multi tasking and with concentration.  Reports an inability to stay focused. He reports having inattention issues.  He would like to start seeing a counselor here who is comfortable with ADD and Asbergers. He has an idea of a counselor he would like to see and plans to contact them for an appointment. He also has history of OCD diagnosis in past.   At his last appointment we discovered that he had an enlarged supraclavicular lymph node on the right side and it was recommended that he get a chest x-ray. He states he did not get this due to insurance but now he has a Stage manager.  He states he would like to get the x-ray now.  He denies fever, chills, cough , night sweats, shortness of breath , or unexplained weight loss.  Complete review of systems performed and documented below  He drinks 1-2 glasses of wine 1-2 times per week.  Smoking 1/2 pack per day.  Works at [employer] 3-4 days and 3-4 days at [employer].   Stable relationship with male partner.  Other providers: none  Immunizations: has not had flu Tetanus - up to date as of March 2016 per patient.  Dentist- has not been in 3 years States he wears his  seatbelt. Guns in home but locked up.     Review of Systems Review of Systems Constitutional: -fever, -chills, -sweats, -unexpected weight change,-fatigue ENT: -runny nose, -ear pain, -sore throat Cardiology:  -chest pain, -palpitations, -edema Respiratory: -cough, -shortness of breath, -wheezing Gastroenterology: -abdominal pain, -nausea, -vomiting, -diarrhea, -constipation  Hematology: -bleeding or bruising problems Musculoskeletal: -arthralgias, -myalgias, -joint swelling, -back pain Ophthalmology: -vision changes Urology: -dysuria, -difficulty urinating, -hematuria, -urinary frequency, -urgency Neurology: -headache, -weakness, -tingling, -numbness       Objective:   Physical Exam BP 120/80 mmHg  Pulse 64  Ht 5' 7.25" (1.708 m)  Wt 161 lb 6.4 oz (73.211 kg)  BMI 25.10 kg/m2  General Appearance:    Alert, cooperative, no distress, appears stated age  Head:    Normocephalic, without obvious abnormality, atraumatic  Eyes:    PERRL, conjunctiva/corneas clear, EOM's intact, fundi    benign  Ears:    Normal TM's and external ear canals  Nose:   Nares normal, mucosa normal, no drainage or sinus   tenderness  Throat:   Lips, mucosa, and tongue normal; teeth and gums normal  Neck:   Supple, no lymphadenopathy;  thyroid:  no   enlargement/tenderness/nodules  Back:    Spine nontender, no curvature, ROM normal, no CVA  tenderness  Lungs:     Clear to auscultation bilaterally without wheezes, rales or     ronchi; respirations unlabored  Chest Wall:    No tenderness or deformity   Heart:    Regular rate and rhythm, S1 and S2 normal, no murmur, rub   or gallop  Breast Exam:    No chest wall tenderness, masses or gynecomastia  Abdomen:     Soft, non-tender, nondistended, normoactive bowel sounds,    no masses, no hepatosplenomegaly  Genitalia:    Normal male external genitalia without lesions.  Testicles without masses.  No inguinal hernias.  Rectal:   Deferred due to age <40 and  lack of symptoms  Extremities:   No clubbing, cyanosis or edema  Pulses:   2+ and symmetric all extremities  Skin:   Skin color, texture, turgor normal, no rashes or lesions  Lymph nodes:   Cervical and axillary nodes normal, right enlarged supraclavicular node  Neurologic:   CNII-XII intact, normal strength, sensation and gait; reflexes 2+ and symmetric throughout          Psych:   Normal mood, affect, hygiene and grooming.    Urinalysis dipstick negative     Assessment & Plan:  Routine general medical examination at a health care facility - Plan: CBC with Differential/Platelet, Comprehensive metabolic panel, POCT urinalysis dipstick  Smoker - Plan: DG Chest 2 View  Family history of hyperlipidemia - Plan: Lipid panel  Lymphadenopathy, supraclavicular - Plan: DG Chest 2 View  Need for prophylactic vaccination and inoculation against influenza - Plan: Flu Vaccine QUAD 36+ mos IM  High risk sexual behavior - Plan: RPR, HIV antibody  History of ADHD  Discussed that I would like for him to get his medical records from his ADD diagnosis and documentation from were he was taking Adderall in the past year. He had some issues with dosing and cannot recall the exact doses he was taking. I do think there is a level of inattention based on screening and my interview. He states he would like to see a LSW here in the area who someone recommended and would like to hold off on medication at this point. We can address this after he has seen the counselor or if he gets his medical records sent to me.  Discussed that the right supraclavicular lymph node has been enlarged at least since November 2016 and he has not yet gone to chest XR as recommended. S[facility]. He agreed to do this. Will follow up accordingly.  Recommend that he cut back on smoking.  Will check lipids due to family history. He also recalls having borderline lipid study in past.  He would  like HIV and syphilis testing. He does have high risk sexual behavior but reports being in a stable monogamous relationship.  Flu shot given. He reports being Up-to-date otherwise however I do not have records. He is long overdue for dental cleaning and recommend that he schedule this in the next couple of months. Discussed eating healthy diet,  And getting at least 150 minutes of physical activity per week.  Discussed safety including seat belt use, checking batteries in fire detector and keeping guns locked up.  Spent a minimum of 25 minutes face-to-face and counseling on ADD , smoking cessation, etc.

## 2015-04-23 NOTE — Progress Notes (Signed)
Pt is scheduled for this Friday 04/25/15 @ 3:50pm at [facility]. Arrive at 3:30pm. Insurance approved this Auth # [id number]. Depending if this shows anything then insurance provider advised that ct soft tissue of neck may be needed.

## 2015-04-23 NOTE — Addendum Note (Signed)
Addended by: Herminio CommonsJOHNSON, Oleva Koo A on: 04/23/2015 03:56 PM   Modules accepted: Orders

## 2015-04-24 LAB — HIV ANTIBODY (ROUTINE TESTING W REFLEX): HIV 1&2 Ab, 4th Generation: NONREACTIVE

## 2015-04-24 LAB — RPR TITER: RPR Titer: 1:256 {titer} — AB

## 2015-04-24 LAB — RPR: RPR Ser Ql: REACTIVE — AB

## 2015-04-25 ENCOUNTER — Other Ambulatory Visit: Payer: BLUE CROSS/BLUE SHIELD

## 2015-04-25 ENCOUNTER — Ambulatory Visit (INDEPENDENT_AMBULATORY_CARE_PROVIDER_SITE_OTHER): Payer: BLUE CROSS/BLUE SHIELD | Admitting: Family Medicine

## 2015-04-25 ENCOUNTER — Encounter: Payer: Self-pay | Admitting: Family Medicine

## 2015-04-25 ENCOUNTER — Ambulatory Visit
Admission: RE | Admit: 2015-04-25 | Discharge: 2015-04-25 | Disposition: A | Discharge status: Home / Self Care | Payer: BLUE CROSS/BLUE SHIELD | Source: Ambulatory Visit | Attending: Family Medicine | Admitting: Family Medicine

## 2015-04-25 ENCOUNTER — Telehealth: Payer: Self-pay | Admitting: Family Medicine

## 2015-04-25 VITALS — BP 126/80 | HR 60 | Wt 160.6 lb

## 2015-04-25 DIAGNOSIS — R9389 Abnormal findings on diagnostic imaging of other specified body structures: Secondary | ICD-10-CM

## 2015-04-25 DIAGNOSIS — R590 Localized enlarged lymph nodes: Secondary | ICD-10-CM

## 2015-04-25 DIAGNOSIS — A539 Syphilis, unspecified: Secondary | ICD-10-CM

## 2015-04-25 DIAGNOSIS — F17200 Nicotine dependence, unspecified, uncomplicated: Secondary | ICD-10-CM

## 2015-04-25 LAB — FLUORESCENT TREPONEMAL AB(FTA)-IGG-BLD: Fluorescent Treponemal ABS: REACTIVE — AB

## 2015-04-25 MED ORDER — DOXYCYCLINE HYCLATE 100 MG PO TABS: 100.0000 mg | ORAL_TABLET | Freq: Two times a day (BID) | ORAL | Status: DC

## 2015-04-25 MED ORDER — IOPAMIDOL (ISOVUE-300) INJECTION 61%
75.0000 mL | Freq: Once | INTRAVENOUS | Status: AC | PRN
  Administered 2015-04-25: 75 mL via INTRAVENOUS

## 2015-04-25 NOTE — Telephone Encounter (Signed)
Called and spoke with patient regarding normal CT chest. Discussed this with Dr. Susann GivensLalonde and we will do watchful waiting on the right enlarged supraclavicular lymph node. The fact that this area has a soft, smooth and relatively moveable mass, normal blood work and normal CT of the chest speaks to the possibility that this could be a cyst or something other than a lymph node.  Patient is ok with this and will let us know if any changes to this area.

## 2015-04-25 NOTE — Progress Notes (Signed)
   Subjective:    Patient ID: Patrick CousinsEvan Cobern, male    DOB: 04/11/1986, 30 y.o.   MRN: [id number]  HPI Chief Complaint  Patient presents with  . follow-up    follow-up per Anjani Feuerborn   He is here for counseling and treatment of Syphilis. He had a positive RPR titer. Denies history of positive RPR. He is asymptomatic.  Has a penicillin allergy. Currently in a relationship with male partner. Reports having 2 sexual partners over past year.    Review of Systems Pertinent positives and negatives in the history of present illness.    Objective:   Physical Exam BP 126/80 mmHg  Pulse 60  Wt 160 lb 9.6 oz (72.848 kg) Alert and oriented and in on acute distress. Not otherwise examined.  Complete physical examination performed 2 days ago.      Assessment & Plan:  Syphilis in male - Plan: GC/chlamydia probe amp, urine  Dr Susann GivensLalonde and I counseled patient on infection, treatment and follow up. Since he has allergy to penicillin, we will treat with Doxycycline bid for 14 days per up to date recommendations. Also testing for GC/Chlamydia today and will follow up with results. Recommend follow up RPR in 3 months, 6 months, and 12 months to ensure effective treatment.

## 2015-04-26 LAB — GC/CHLAMYDIA PROBE AMP, URINE
Chlamydia, Swab/Urine, PCR: NOT DETECTED
GC Probe Amp, Urine: NOT DETECTED

## 2015-04-29 ENCOUNTER — Telehealth: Payer: Self-pay

## 2015-04-29 NOTE — Telephone Encounter (Signed)
Health department called, Patrick NevinsCorey Alexander, and needed to know why the test for syphilis was done. Wanted to know marital status, race, and other demographics. Also needed the ratio for the test all was given to him

## 2015-05-01 ENCOUNTER — Telehealth: Payer: Self-pay | Admitting: Family Medicine

## 2015-05-01 MED ORDER — DOXYCYCLINE HYCLATE 100 MG PO TABS: 100.0000 mg | ORAL_TABLET | Freq: Two times a day (BID) | ORAL | Status: DC

## 2015-05-01 NOTE — Telephone Encounter (Signed)
Pt says that he need the Doxycycline 100mg  sent to [facility], [address], [place], [place] [facility] a preferred pharmacy under his insurance now

## 2015-05-01 NOTE — Telephone Encounter (Signed)
Sent to pharmacy 

## 2015-06-02 ENCOUNTER — Ambulatory Visit (INDEPENDENT_AMBULATORY_CARE_PROVIDER_SITE_OTHER): Payer: BLUE CROSS/BLUE SHIELD | Admitting: Family Medicine

## 2015-06-02 ENCOUNTER — Encounter: Payer: Self-pay | Admitting: Family Medicine

## 2015-06-02 VITALS — BP 118/68 | HR 72 | Ht 67.25 in | Wt 159.2 lb

## 2015-06-02 DIAGNOSIS — Z720 Tobacco use: Secondary | ICD-10-CM | POA: Diagnosis not present

## 2015-06-02 DIAGNOSIS — G479 Sleep disorder, unspecified: Secondary | ICD-10-CM

## 2015-06-02 DIAGNOSIS — Z716 Tobacco abuse counseling: Secondary | ICD-10-CM

## 2015-06-02 DIAGNOSIS — F17200 Nicotine dependence, unspecified, uncomplicated: Secondary | ICD-10-CM

## 2015-06-02 HISTORY — DX: Nicotine dependence, unspecified, uncomplicated: F17.200

## 2015-06-02 HISTORY — DX: Sleep disorder, unspecified: G47.9

## 2015-06-02 NOTE — Patient Instructions (Signed)
1-800-quit-now We can send you for counseling at [facility] or [facility] also.  Smoking Cessation, Tips for Success If you are ready to quit smoking, congratulations! You have chosen to help yourself be healthier. Cigarettes bring nicotine, tar, carbon monoxide, and other irritants into your body. Your lungs, heart, and blood vessels will be able to work better without these poisons. There are many different ways to quit smoking. Nicotine gum, nicotine patches, a nicotine inhaler, or nicotine nasal spray can help with physical craving. Hypnosis, support groups, and medicines help break the habit of smoking. WHAT THINGS CAN I DO TO MAKE QUITTING EASIER?  Here are some tips to help you quit for good:  Pick a date when you will quit smoking completely. Tell all of your friends and family about your plan to quit on that date.  Do not try to slowly cut down on the number of cigarettes you are smoking. Pick a quit date and quit smoking completely starting on that day.  Throw away all cigarettes.   Clean and remove all ashtrays from your home, work, and car.  On a card, write down your reasons for quitting. Carry the card with you and read it when you get the urge to smoke.  Cleanse your body of nicotine. Drink enough water and fluids to keep your urine clear or pale yellow. Do this after quitting to flush the nicotine from your body.  Learn to predict your moods. Do not let a bad situation be your excuse to have a cigarette. Some situations in your life might tempt you into wanting a cigarette.  Never have "just one" cigarette. It leads to wanting another and another. Remind yourself of your decision to quit.  Change habits associated with smoking. If you smoked while driving or when feeling stressed, try other activities to replace smoking. Stand up when drinking your coffee. Brush your teeth after eating. Sit in a different chair when you read the paper. Avoid alcohol while trying to quit, and try to  drink fewer caffeinated beverages. Alcohol and caffeine may urge you to smoke.  Avoid foods and drinks that can trigger a desire to smoke, such as sugary or spicy foods and alcohol.  Ask people who smoke not to smoke around you.  Have something planned to do right after eating or having a cup of coffee. For example, plan to take a walk or exercise.  Try a relaxation exercise to calm you down and decrease your stress. Remember, you may be tense and nervous for the first 2 weeks after you quit, but this will pass.  Find new activities to keep your hands busy. Play with a pen, coin, or rubber band. Doodle or draw things on paper.  Brush your teeth right after eating. This will help cut down on the craving for the taste of tobacco after meals. You can also try mouthwash.   Use oral substitutes in place of cigarettes. Try using lemon drops, carrots, cinnamon sticks, or chewing gum. Keep them handy so they are available when you have the urge to smoke.  When you have the urge to smoke, try deep breathing.  Designate your home as a nonsmoking area.  If you are a heavy smoker, ask your health care provider about a prescription for nicotine chewing gum. It can ease your withdrawal from nicotine.  Reward yourself. Set aside the cigarette money you save and buy yourself something nice.  Look for support from others. Join a support group or smoking cessation  program. Ask someone at home or at work to help you with your plan to quit smoking.  Always ask yourself, "Do I need this cigarette or is this just a reflex?" Tell yourself, "Today, I choose not to smoke," or "I do not want to smoke." You are reminding yourself of your decision to quit.  Do not replace cigarette smoking with electronic cigarettes (commonly called e-cigarettes). The safety of e-cigarettes is unknown, and some may contain harmful chemicals.  If you relapse, do not give up! Plan ahead and think about what you will do the next  time you get the urge to smoke. HOW WILL I FEEL WHEN I QUIT SMOKING? You may have symptoms of withdrawal because your body is used to nicotine (the addictive substance in cigarettes). You may crave cigarettes, be irritable, feel very hungry, cough often, get headaches, or have difficulty concentrating. The withdrawal symptoms are only temporary. They are strongest when you first quit but will go away within 10-14 days. When withdrawal symptoms occur, stay in control. Think about your reasons for quitting. Remind yourself that these are signs that your body is healing and getting used to being without cigarettes. Remember that withdrawal symptoms are easier to treat than the major diseases that smoking can cause.  Even after the withdrawal is over, expect periodic urges to smoke. However, these cravings are generally short lived and will go away whether you smoke or not. Do not smoke! WHAT RESOURCES ARE AVAILABLE TO HELP ME QUIT SMOKING? Your health care provider can direct you to community resources or hospitals for support, which may include:  Group support.  Education.  Hypnosis.  Therapy.   This information is not intended to replace advice given to you by your health care provider. Make sure you discuss any questions you have with your health care provider.   Document Released: 01/02/2004 Document Revised: 04/26/2014 Document Reviewed: 09/21/2012 Elsevier Interactive Patient Education [employer]2016 Elsevier Inc.

## 2015-06-02 NOTE — Progress Notes (Signed)
Subjective:    Patient ID: Patrick Alexander, male    DOB: 1986/01/20, 30 y.o.   MRN: [id number]  HPI Chief Complaint  Patient presents with  . Consult    has had sleep issues since he was about 12. Moved in with significant other 4 months ago and was told to come in aand have sleep study. Also wants to talk about smoking cessation.    He is here for smoking cessation and  complaints of sleep issues.  He states he would like to stop smoking, is currently smoking 2 ppd which is an increase from his norm. He reports a 10 year smoking history. His new partner does not smoke and this is one motivating factor for him to stop. He also reports having bronchitis in past few months and this scared him.  States he smokes more when he is working because his co-workers are going for smoke breaks also. He states he also thinks he smokes a lot of times because he feels tired and this energizes him. Reports smoking much less when he is home and if he is off from work for a few days he will only smoke about 1 pack over 2-3 days. States he has tried nicoderm patch in past and it helped for a while but then he started smoking again. He states nicotine gum caused him to have reflux and did not work. He has at least 2 attempts to stop in past. He would like help with smoking cessation and curious about trying Chantix. He reports history of seizures.   He states his partner has noticed that he stops breathing when he sleeps and that he snores loudly. He reports daytime sleepiness, feeling tired, and occasionally wakes up with a headache. Has never had a sleep study. States his father has sleep apnea and he thinks he may have this as well. Denies difficulty falling asleep or staying asleep but states even when he gets 7 hours of sleep he still wakes up feeling tired.    Reviewed allergies, medications, past medical and social history.   Review of Systems Pertinent positives and negatives in the history of present  illness.     Objective:   Physical Exam BP 118/68 mmHg  Pulse 72  Ht 5' 7.25" (1.708 m)  Wt 159 lb 3.2 oz (72.213 kg)  BMI 24.75 kg/m2 Alert and in no distress. Tympanic membranes and canals are normal. Pharyngeal area is normal. Neck is supple without adenopathy.  Cardiac exam shows a regular sinus rhythm without murmurs or gallops. Lungs are clear to auscultation.  Epworth sleepiness scale 14.     Assessment & Plan:  Encounter for smoking cessation counseling  Sleep disturbance - Plan: Home sleep test  Counseling in smoking cessation provided. Discussed that smoking for him appears to be more of a habit as well as possible addiction. Discussed that he should avoid taking smoke breaks with his co-workers for now and try to do something else on his break such as taking a walk, drinking a bottle of water, chewing gum or on a straw. Advised him to make of list of other things he can do instead of smoking. Discussed that Chantix is not a safe choice based on his history of seizures. Provided self help materials and the free 800- QUIT-Now number. Also discussed the possibility of referral to counseling in the future if he would like. May consider Wellbutrin in future if patient would like. Discussed this as well. Spent at least 15 minutes  counseling on smoking cessation.  Suspect that he may have sleep apnea based on history and Epworth sleepiness scale score. Referral made for home sleep study. Recommend he follow up as needed for smoking cessation.

## 2015-06-09 ENCOUNTER — Other Ambulatory Visit: Payer: Self-pay | Admitting: Internal Medicine

## 2015-06-09 DIAGNOSIS — G479 Sleep disorder, unspecified: Secondary | ICD-10-CM

## 2015-06-12 ENCOUNTER — Encounter: Payer: Self-pay | Admitting: Family Medicine

## 2015-06-28 ENCOUNTER — Ambulatory Visit (HOSPITAL_BASED_OUTPATIENT_CLINIC_OR_DEPARTMENT_OTHER): Payer: BLUE CROSS/BLUE SHIELD | Attending: Family Medicine

## 2015-06-28 VITALS — Ht 67.0 in | Wt 158.0 lb

## 2015-06-28 DIAGNOSIS — R0683 Snoring: Secondary | ICD-10-CM | POA: Diagnosis not present

## 2015-06-28 DIAGNOSIS — R000 Tachycardia, unspecified: Secondary | ICD-10-CM | POA: Insufficient documentation

## 2015-06-28 DIAGNOSIS — I498 Other specified cardiac arrhythmias: Secondary | ICD-10-CM | POA: Diagnosis not present

## 2015-06-28 DIAGNOSIS — R0600 Dyspnea, unspecified: Secondary | ICD-10-CM | POA: Diagnosis not present

## 2015-06-28 DIAGNOSIS — G4733 Obstructive sleep apnea (adult) (pediatric): Secondary | ICD-10-CM | POA: Diagnosis not present

## 2015-06-28 DIAGNOSIS — R0609 Other forms of dyspnea: Secondary | ICD-10-CM | POA: Insufficient documentation

## 2015-06-28 DIAGNOSIS — G4730 Sleep apnea, unspecified: Secondary | ICD-10-CM | POA: Diagnosis present

## 2015-06-28 DIAGNOSIS — G479 Sleep disorder, unspecified: Secondary | ICD-10-CM | POA: Insufficient documentation

## 2015-06-28 DIAGNOSIS — R5383 Other fatigue: Secondary | ICD-10-CM | POA: Insufficient documentation

## 2015-06-28 DIAGNOSIS — R0689 Other abnormalities of breathing: Secondary | ICD-10-CM

## 2015-07-05 DIAGNOSIS — G479 Sleep disorder, unspecified: Secondary | ICD-10-CM | POA: Diagnosis not present

## 2015-07-05 DIAGNOSIS — R0689 Other abnormalities of breathing: Secondary | ICD-10-CM

## 2015-07-05 DIAGNOSIS — R0600 Dyspnea, unspecified: Secondary | ICD-10-CM

## 2015-07-05 NOTE — Progress Notes (Signed)
   Patient Name: Patrick Alexander, Constant Study Date: 06/28/2015 Gender: Male D.O.B: [birth year] Age (years): 29 Referring Provider: Avanell ShackletonVickie L Henson NP Height (inches): 67 Interpreting Physician: Jetty Duhamellinton Young MD, ABSM Weight (lbs): 158 RPSGT: Cherylann ParrDubili, Fred BMI: 25 MRN: [id number] Neck Size: 15.00 CLINICAL INFORMATION Sleep Study Type: NPSG Indication for sleep study: Fatigue, Snoring, Witnessed Apneas Epworth Sleepiness Score: 14  SLEEP STUDY TECHNIQUE As per the AASM Manual for the Scoring of Sleep and Associated Events v2.3 (April 2016) with a hypopnea requiring 4% desaturations. The channels recorded and monitored were frontal, central and occipital EEG, electrooculogram (EOG), submentalis EMG (chin), nasal and oral airflow, thoracic and abdominal wall motion, anterior tibialis EMG, snore microphone, electrocardiogram, and pulse oximetry.  MEDICATIONS Patient's medications include: charted for review. Medications self-administered by patient during sleep study : No sleep medicine administered.  SLEEP ARCHITECTURE The study was initiated at 10:21:53 PM and ended at 4:34:56 AM. Sleep onset time was 23.9 minutes and the sleep efficiency was 52.5%. The total sleep time was 195.7 minutes. Stage REM latency was 146.5 minutes. The patient spent 6.39% of the night in stage N1 sleep, 73.94% in stage N2 sleep, 19.42% in stage N3 and 0.26% in REM. Alpha intrusion was absent. Supine sleep was 95.66%. Wake after sleep onset 153 minutes  RESPIRATORY PARAMETERS The overall apnea/hypopnea index (AHI) was 19.0 per hour. There were 19 total apneas, including 19 obstructive, 0 central and 0 mixed apneas. There were 43 hypopneas and 0 RERAs. The AHI during Stage REM sleep was 0.0 per hour. AHI while supine was 19.9 per hour. The mean oxygen saturation was 96.32%. The minimum SpO2 during sleep was 86.00%. Loud snoring was noted during this study.  CARDIAC DATA The 2 lead EKG demonstrated sinus  rhythm. The mean heart rate was 72.64 beats per minute. Other EKG findings include: sinus tachycardia  LEG MOVEMENT DATA The total PLMS were 0 with a resulting PLMS index of 0.00. Associated arousal with leg movement index was 0.0 . IMPRESSIONS - Moderate obstructive sleep apnea occurred during this study (AHI = 19.0/h). - There were not enough early events to meet protocol requirements for split CPAP titration on this study night. - No significant central sleep apnea occurred during this study (CAI = 0.0/h). - Mild oxygen desaturation was noted during this study (Min O2 = 86.00%). - The patient snored with Loud snoring volume. - No cardiac abnormalities were noted during this study. - Clinically significant periodic limb movements did not occur during sleep. No significant associated arousals. - The patient had difficulty initiating and maintaining sleep "restless", with no sleep medication taken.  DIAGNOSIS - Obstructive Sleep Apnea (327.23 [G47.33 ICD-10])  RECOMMENDATIONS - Therapeutic CPAP titration to determine optimal pressure required to alleviate sleep disordered breathing. - Positional therapy avoiding supine position during sleep. - Avoid alcohol, sedatives and other CNS depressants that may worsen sleep apnea and disrupt normal sleep architecture. - Sleep hygiene should be reviewed to assess factors that may improve sleep quality. - Weight management and regular exercise should be initiated or continued if appropriate.  Waymon BudgeYOUNG,CLINTON D Diplomate, American Board of Sleep Medicine  ELECTRONICALLY SIGNED ON:  07/05/2015, 11:28 AM [facility] PH: [phone]   FX: [phone] ACCREDITED BY THE AMERICAN ACADEMY OF SLEEP MEDICINE

## 2015-07-07 ENCOUNTER — Telehealth: Payer: Self-pay | Admitting: Internal Medicine

## 2015-07-07 DIAGNOSIS — G4733 Obstructive sleep apnea (adult) (pediatric): Secondary | ICD-10-CM

## 2015-07-07 NOTE — Telephone Encounter (Signed)
Spoke to patient and he wants cpap and so i have sent it through epic and will let melissa with [facility] know that order is in epic

## 2015-07-09 ENCOUNTER — Ambulatory Visit (INDEPENDENT_AMBULATORY_CARE_PROVIDER_SITE_OTHER): Payer: BLUE CROSS/BLUE SHIELD | Admitting: Family Medicine

## 2015-07-09 ENCOUNTER — Encounter: Payer: Self-pay | Admitting: Family Medicine

## 2015-07-09 VITALS — BP 128/80 | HR 64 | Wt 165.2 lb

## 2015-07-09 DIAGNOSIS — G4733 Obstructive sleep apnea (adult) (pediatric): Secondary | ICD-10-CM | POA: Diagnosis not present

## 2015-07-09 NOTE — Progress Notes (Signed)
   Subjective:    Patient ID: Patrick Alexander, male    DOB: 03/31/1986, 30 y.o.   MRN: [id number]  HPI Chief Complaint  Patient presents with  . discuss sleep study    discuss sleep study-options    He is here to follow up on sleep study. He was diagnosed with obstructive sleep apnea with recommendations of CPAP and to return for determination of titration. As a AHI was 19. His initial thoughts regarding CPAP is that he is okay with doing this however he would prefer not to return for an additional study. He states he did sleep well initially with the CPAP on. He would like to discuss the option of a dental device for sleep apnea. States he does feels tired most days and falls asleep when at rest.   He has cut back on smoking, smoking 4-5 cigarettes daily.    Review of Systems Pertinent positives and negatives in the history of present illness.     Objective:   Physical Exam BP 128/80 mmHg  Pulse 64  Wt 165 lb 3.2 oz (74.934 kg)  Alert and in no distress.  Pharyngeal area is normal. Neck is supple without adenopathy or thyromegaly.       Assessment & Plan:  OSA (obstructive sleep apnea)  Discussed option of dental device for sleep apnea. Phone numbers provided to dentists who do this. He will check into it and also with his insurance company to find out what his co-pay would be. He will let us know if he decides to do this or not and if he does not opt for the dental device, we will need to order CPAP and titration study. He is aware that if he does try the dental device, he will need to return for an evaluation to see if it is effective. Dr. Susann GivensLalonde is also aware of this patient and in agreement with plan of care.

## 2015-07-09 NOTE — Patient Instructions (Addendum)
Irene Limbo, DDS  [phone] or Yancey Flemings DDS [phone] for dental appliance for obstructive sleep apnea.  Call and let us know what you to decide to do about this. If you go with the dental appliance then we want want to follow-up and see if this is helping. If it is too expensive then we can refer you for the CPAP machine  Sleep Apnea  Sleep apnea is a sleep disorder characterized by abnormal pauses in breathing while you sleep. When your breathing pauses, the level of oxygen in your blood decreases. This causes you to move out of deep sleep and into light sleep. As a result, your quality of sleep is poor, and the system that carries your blood throughout your body (cardiovascular system) experiences stress. If sleep apnea remains untreated, the following conditions can develop:  High blood pressure (hypertension).  Coronary artery disease.  Inability to achieve or maintain an erection (impotence).  Impairment of your thought process (cognitive dysfunction). There are three types of sleep apnea: 1. Obstructive sleep apnea--Pauses in breathing during sleep because of a blocked airway. 2. Central sleep apnea--Pauses in breathing during sleep because the area of the brain that controls your breathing does not send the correct signals to the muscles that control breathing. 3. Mixed sleep apnea--A combination of both obstructive and central sleep apnea. RISK FACTORS The following risk factors can increase your risk of developing sleep apnea:  Being overweight.  Smoking.  Having narrow passages in your nose and throat.  Being of older age.  Being male.  Alcohol use.  Sedative and tranquilizer use.  Ethnicity. Among individuals younger than 35 years, African Americans are at increased risk of sleep apnea. SYMPTOMS   Difficulty staying asleep.  Daytime sleepiness and fatigue.  Loss of energy.  Irritability.  Loud, heavy snoring.  Morning headaches.  Trouble  concentrating.  Forgetfulness.  Decreased interest in sex.  Unexplained sleepiness. DIAGNOSIS  In order to diagnose sleep apnea, your caregiver will perform a physical examination. A sleep study done in the comfort of your own home may be appropriate if you are otherwise healthy. Your caregiver may also recommend that you spend the night in a sleep lab. In the sleep lab, several monitors record information about your heart, lungs, and brain while you sleep. Your leg and arm movements and blood oxygen level are also recorded. TREATMENT The following actions may help to resolve mild sleep apnea:  Sleeping on your side.   Using a decongestant if you have nasal congestion.   Avoiding the use of depressants, including alcohol, sedatives, and narcotics.   Losing weight and modifying your diet if you are overweight. There also are devices and treatments to help open your airway:  Oral appliances. These are custom-made mouthpieces that shift your lower jaw forward and slightly open your bite. This opens your airway.  Devices that create positive airway pressure. This positive pressure "splints" your airway open to help you breathe better during sleep. The following devices create positive airway pressure:  Continuous positive airway pressure (CPAP) device. The CPAP device creates a continuous level of air pressure with an air pump. The air is delivered to your airway through a mask while you sleep. This continuous pressure keeps your airway open.  Nasal expiratory positive airway pressure (EPAP) device. The EPAP device creates positive air pressure as you exhale. The device consists of single-use valves, which are inserted into each nostril and held in place by adhesive. The valves create very little resistance when  you inhale but create much more resistance when you exhale. That increased resistance creates the positive airway pressure. This positive pressure while you exhale keeps your airway  open, making it easier to breath when you inhale again.  Bilevel positive airway pressure (BPAP) device. The BPAP device is used mainly in patients with central sleep apnea. This device is similar to the CPAP device because it also uses an air pump to deliver continuous air pressure through a mask. However, with the BPAP machine, the pressure is set at two different levels. The pressure when you exhale is lower than the pressure when you inhale.  Surgery. Typically, surgery is only done if you cannot comply with less invasive treatments or if the less invasive treatments do not improve your condition. Surgery involves removing excess tissue in your airway to create a wider passage way.   This information is not intended to replace advice given to you by your health care provider. Make sure you discuss any questions you have with your health care provider.   Document Released: 03/26/2002 Document Revised: 04/26/2014 Document Reviewed: 08/12/2011 Elsevier Interactive Patient Education [employer]vier Inc.

## 2015-07-22 ENCOUNTER — Telehealth: Payer: Self-pay | Admitting: Family Medicine

## 2015-07-22 ENCOUNTER — Ambulatory Visit: Payer: BLUE CROSS/BLUE SHIELD | Admitting: Family Medicine

## 2015-07-30 NOTE — Telephone Encounter (Signed)
07/30/2015 °

## 2015-08-19 DIAGNOSIS — G4733 Obstructive sleep apnea (adult) (pediatric): Secondary | ICD-10-CM | POA: Insufficient documentation

## 2015-08-19 DIAGNOSIS — J301 Allergic rhinitis due to pollen: Secondary | ICD-10-CM | POA: Insufficient documentation

## 2015-08-19 DIAGNOSIS — R768 Other specified abnormal immunological findings in serum: Secondary | ICD-10-CM | POA: Insufficient documentation

## 2015-10-06 DIAGNOSIS — J309 Allergic rhinitis, unspecified: Secondary | ICD-10-CM | POA: Insufficient documentation

## 2016-04-06 DIAGNOSIS — F6089 Other specific personality disorders: Secondary | ICD-10-CM | POA: Insufficient documentation

## 2016-04-06 DIAGNOSIS — F3111 Bipolar disorder, current episode manic without psychotic features, mild: Secondary | ICD-10-CM | POA: Insufficient documentation

## 2017-09-27 IMAGING — CT CT CHEST W/ CM
2 of 3 series · 15 of 30 positions shown, 17 images · IV contrast (75CC ISOVUE 300)
Comparison: 04/23/2015

CLINICAL DATA: Lymphadenopathy, abnormal chest x-ray, cough for 3
months, patient smokes

EXAM:
CT CHEST WITH CONTRAST
TECHNIQUE: Multidetector CT imaging of the chest was performed during
intravenous contrast administration.
CONTRAST:  75mL 3RCT5L-RHH IOPAMIDOL (3RCT5L-RHH) INJECTION 61%

[Series 3: chest with · axial · 0.70mm/px · z∈[-309,-44]mm · 7 of 71 slices shown, 9 images]
[im 9/71  mediastinal]
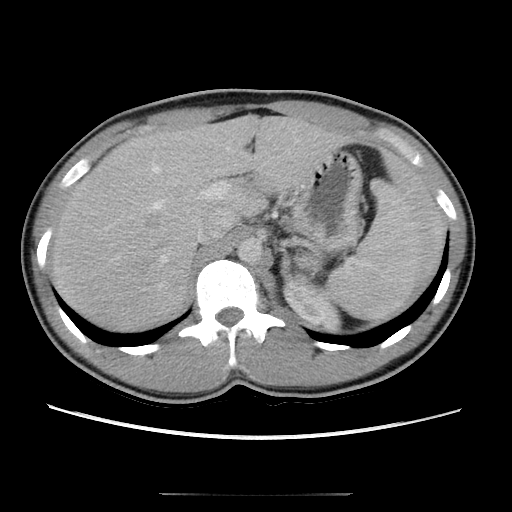
[im 9/71  lung]
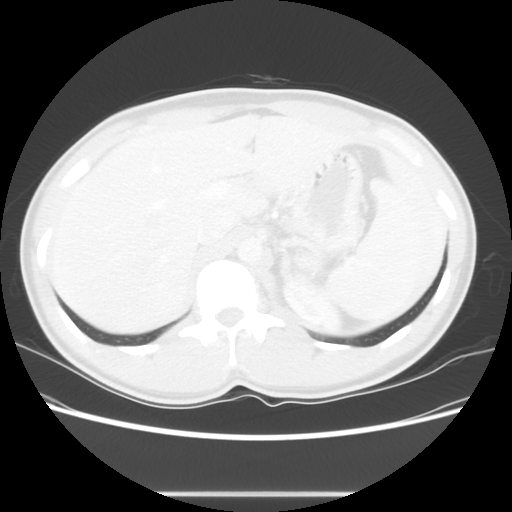
[im 18/71  lung]
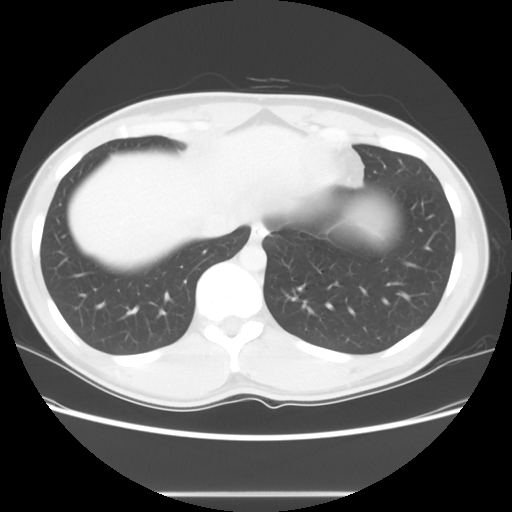
[im 27/71  lung]
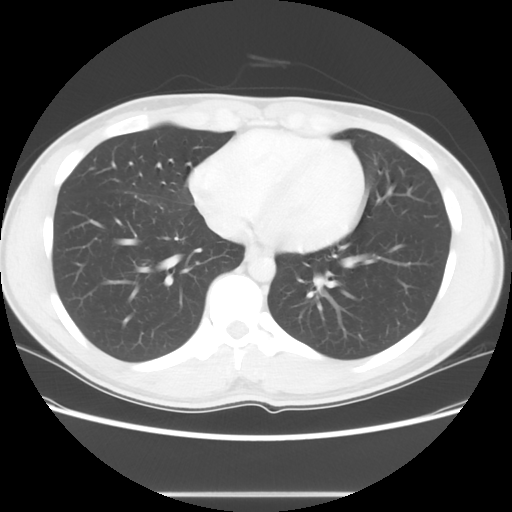
[im 36/71  lung]
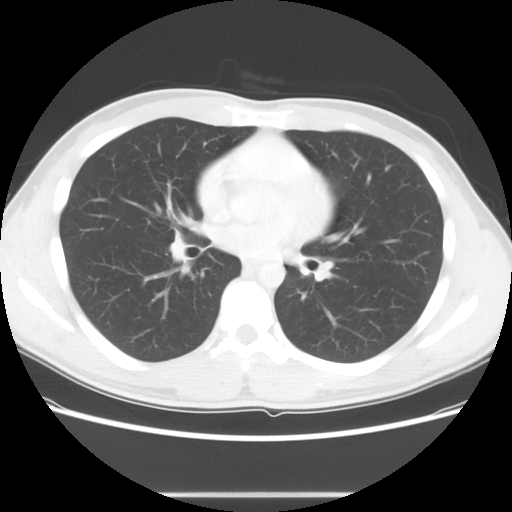
[im 44/71  mediastinal]
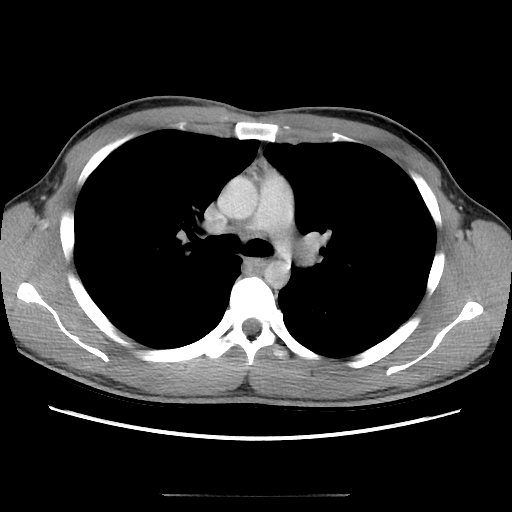
[im 44/71  lung]
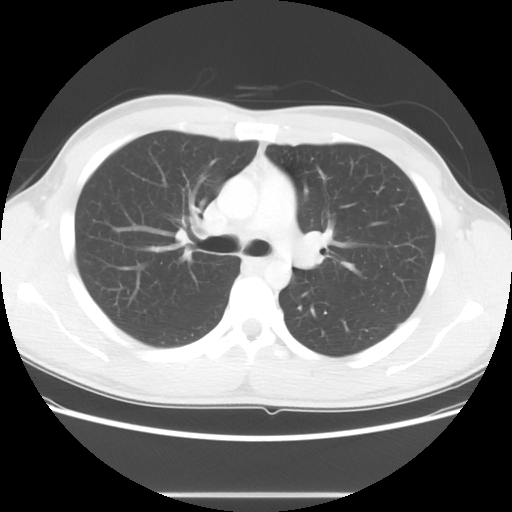
[im 53/71  lung]
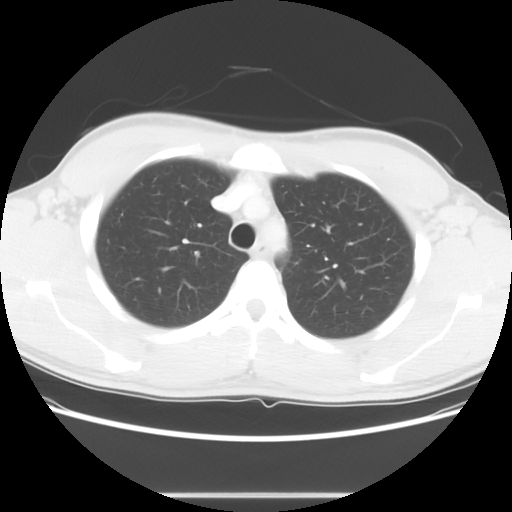
[im 62/71  lung]
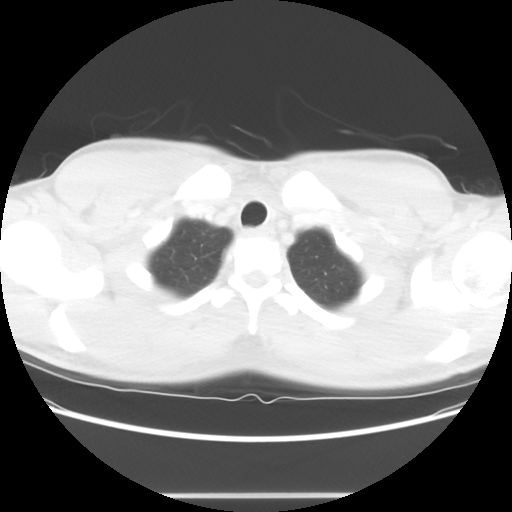

[Series 602: sagittal body · sagittal · 0.70mm/px · 8 of 145 slices shown]
[im 17/145  mediastinal]
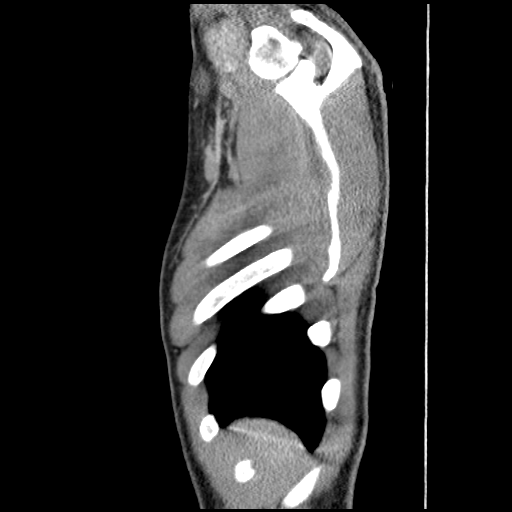
[im 33/145  mediastinal]
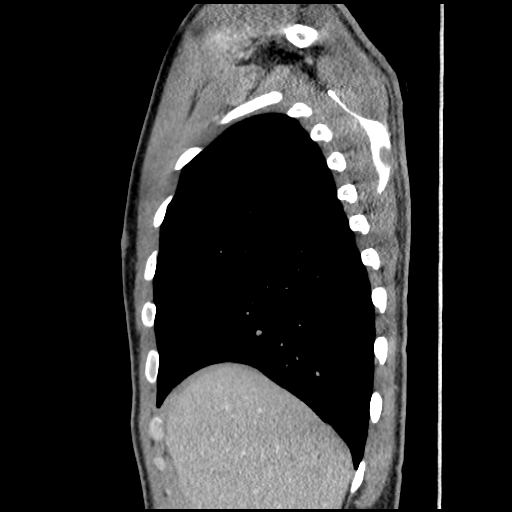
[im 49/145  mediastinal]
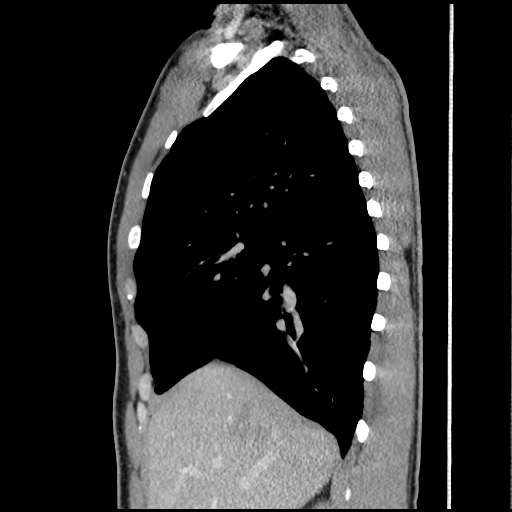
[im 65/145  mediastinal]
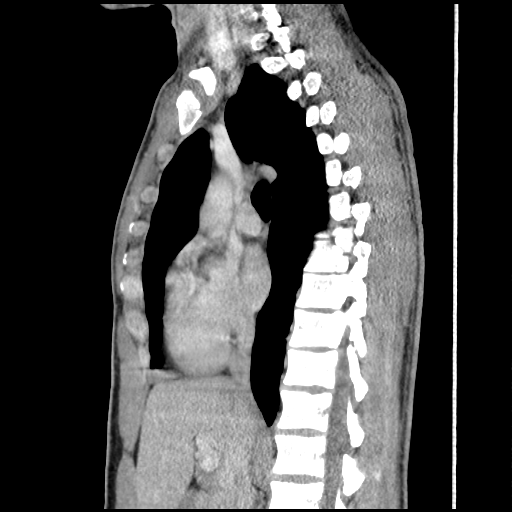
[im 81/145  mediastinal]
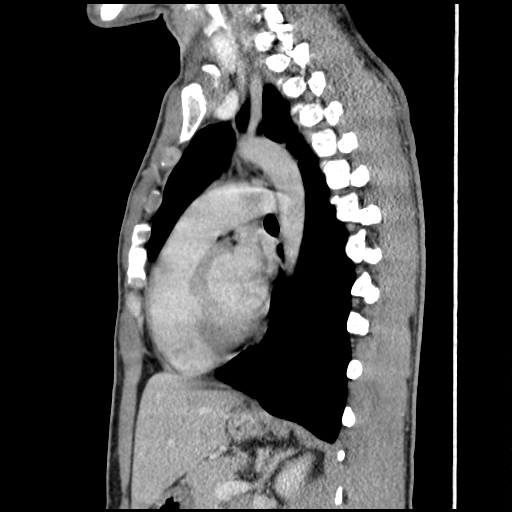
[im 97/145  mediastinal]
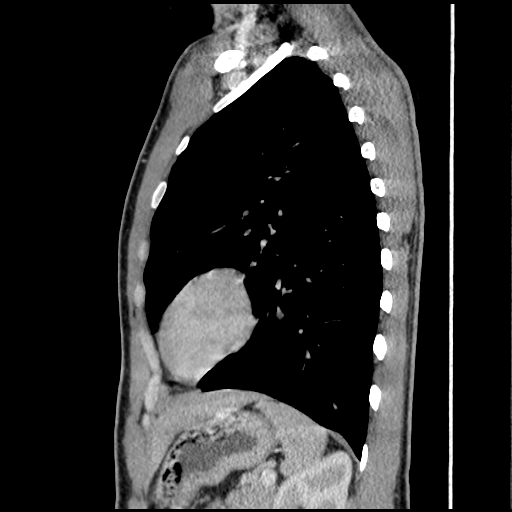
[im 113/145  mediastinal]
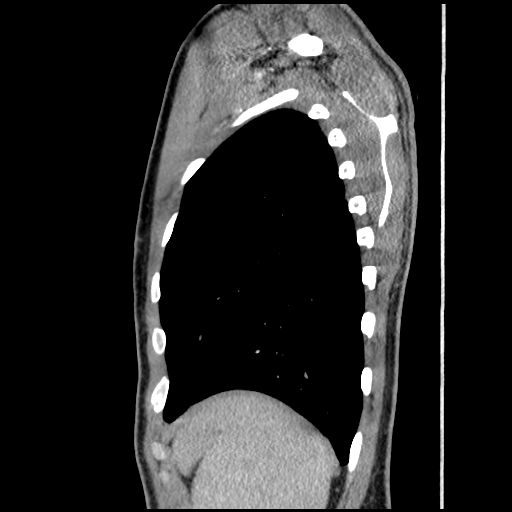
[im 129/145  mediastinal]
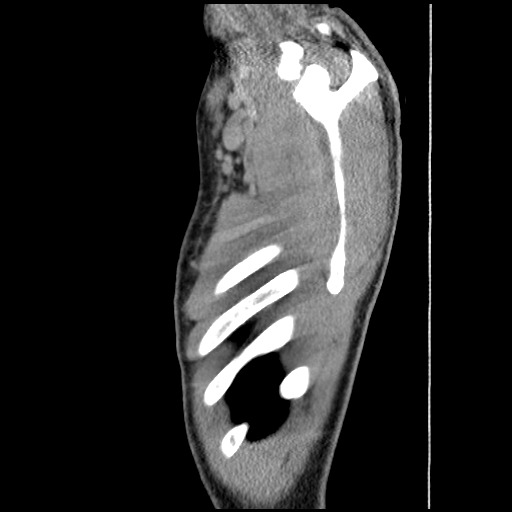

[15 of 30 positions shown; findings below may reference images not displayed]

FINDINGS: Mediastinum/Nodes: Thyroid and thoracic inlet are normal. No
significant hilar or mediastinal adenopathy. Great vessels appear
normal. Normal heart size. No pericardial effusion.

Lungs/Pleura: The lungs are clear. There are no pleural effusions.
Specifically the infra hilar region on the right is normal.

Upper abdomen: No significant abnormalities

Musculoskeletal: No acute findings
IMPRESSION: No significant abnormalities.  Right lung is clear.

## 2018-05-21 ENCOUNTER — Ambulatory Visit (HOSPITAL_COMMUNITY)
Admission: RE | Admit: 2018-05-21 | Discharge: 2018-05-21 | Disposition: A | Discharge status: Home / Self Care | Payer: Self-pay | Attending: Psychiatry | Admitting: Psychiatry

## 2018-05-21 DIAGNOSIS — F902 Attention-deficit hyperactivity disorder, combined type: Secondary | ICD-10-CM | POA: Insufficient documentation

## 2018-05-21 DIAGNOSIS — F331 Major depressive disorder, recurrent, moderate: Secondary | ICD-10-CM | POA: Insufficient documentation

## 2018-05-21 DIAGNOSIS — F17210 Nicotine dependence, cigarettes, uncomplicated: Secondary | ICD-10-CM | POA: Insufficient documentation

## 2018-05-21 NOTE — H&P (Signed)
Behavioral Health Medical Screening Exam  Patrick Alexander is an 33 y.o. male.  Total Time spent with patient: 30 minutes  Psychiatric Specialty Exam: Physical Exam  Constitutional: He is oriented to person, place, and time. He appears well-developed and well-nourished. No distress.  HENT:  Head: Normocephalic and atraumatic.  Right Ear: External ear normal.  Left Ear: External ear normal.  Eyes: Pupils are equal, round, and reactive to light. Right eye exhibits no discharge. Left eye exhibits no discharge.  Respiratory: Effort normal. No respiratory distress.  Musculoskeletal: Normal range of motion.  Neurological: He is alert and oriented to person, place, and time.  Skin: Skin is warm and dry. He is not diaphoretic.  Psychiatric: His speech is normal. He is not withdrawn and not actively hallucinating. Thought content is not paranoid and not delusional. He exhibits a depressed mood. He expresses no homicidal and no suicidal ideation.    Review of Systems  Constitutional: Negative for chills, fever and weight loss.  Respiratory: Negative for cough and shortness of breath.   Cardiovascular: Negative for chest pain.  Gastrointestinal: Negative for nausea.  Neurological: Negative for dizziness.  Psychiatric/Behavioral: Positive for depression. Negative for hallucinations, memory loss, substance abuse and suicidal ideas. The patient is nervous/anxious and has insomnia.   All other systems reviewed and are negative.   Blood pressure 122/80, pulse (!) 104, temperature 98.6 F (37 C), resp. rate 18, SpO2 100 %.There is no height or weight on file to calculate BMI.  General Appearance: Casual and Well Groomed  Eye Contact:  Fair  Speech:  Clear and Coherent and Normal Rate  Volume:  Normal  Mood:  Anxious, Depressed and Worthless  Affect:  Congruent, Depressed and Tearful  Thought Process:  Coherent, Goal Directed and Descriptions of Associations: Intact  Orientation:  Full (Time, Place,  and Person)  Thought Content:  Logical, Hallucinations: None and Rumination  Suicidal Thoughts:  No  Homicidal Thoughts:  No  Memory:  Immediate;   Good Recent;   Good  Judgement:  Good  Insight:  Fair  Psychomotor Activity:  Normal  Concentration: Concentration: Fair and Attention Span: Fair  Recall:  Good  Fund of Knowledge:Good  Language: Good  Akathisia:  No  Handed:  Right  AIMS (if indicated):     Assets:  Communication Skills Desire for Improvement Housing Intimacy Leisure Time Physical Health Social Support Transportation  Sleep:       Musculoskeletal: Strength & Muscle Tone: within normal limits Gait & Station: normal   Blood pressure 122/80, pulse (!) 104, temperature 98.6 F (37 C), resp. rate 18, SpO2 100 %.  Recommendations:  Based on my evaluation the patient does not appear to have an emergency medical condition.  No evidence of imminent risk to self or others at present.   Patient does not meet criteria for psychiatric inpatient admission. Supportive therapy provided about ongoing stressors. Discussed crisis plan, support from social network, calling 911, coming to the Emergency Department, and calling Suicide Hotline.  Discussed refraining from using alcohol or illicit substances, as they can affect your mood and can cause depression, anxiety or other concerning symptoms.   Jackelyn Poling, NP 05/21/2018, 4:33 AM
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
## 2022-03-08 ENCOUNTER — Other Ambulatory Visit (HOSPITAL_COMMUNITY)
Admission: EM | Admit: 2022-03-08 | Discharge: 2022-03-15 | Payer: No Payment, Other | Attending: Psychiatry | Admitting: Psychiatry

## 2022-03-08 DIAGNOSIS — R4589 Other symptoms and signs involving emotional state: Secondary | ICD-10-CM

## 2022-03-08 DIAGNOSIS — Z79899 Other long term (current) drug therapy: Secondary | ICD-10-CM | POA: Insufficient documentation

## 2022-03-08 DIAGNOSIS — F902 Attention-deficit hyperactivity disorder, combined type: Secondary | ICD-10-CM | POA: Diagnosis not present

## 2022-03-08 DIAGNOSIS — F4310 Post-traumatic stress disorder, unspecified: Secondary | ICD-10-CM | POA: Insufficient documentation

## 2022-03-08 DIAGNOSIS — Z1152 Encounter for screening for COVID-19: Secondary | ICD-10-CM | POA: Insufficient documentation

## 2022-03-08 DIAGNOSIS — F1020 Alcohol dependence, uncomplicated: Secondary | ICD-10-CM | POA: Insufficient documentation

## 2022-03-08 DIAGNOSIS — F322 Major depressive disorder, single episode, severe without psychotic features: Secondary | ICD-10-CM | POA: Insufficient documentation

## 2022-03-08 DIAGNOSIS — Z9152 Personal history of nonsuicidal self-harm: Secondary | ICD-10-CM | POA: Diagnosis present

## 2022-03-08 DIAGNOSIS — F1210 Cannabis abuse, uncomplicated: Secondary | ICD-10-CM | POA: Insufficient documentation

## 2022-03-08 DIAGNOSIS — R45851 Suicidal ideations: Secondary | ICD-10-CM

## 2022-03-08 DIAGNOSIS — F1021 Alcohol dependence, in remission: Secondary | ICD-10-CM | POA: Diagnosis present

## 2022-03-08 DIAGNOSIS — F509 Eating disorder, unspecified: Secondary | ICD-10-CM | POA: Insufficient documentation

## 2022-03-08 DIAGNOSIS — F17200 Nicotine dependence, unspecified, uncomplicated: Secondary | ICD-10-CM | POA: Insufficient documentation

## 2022-03-08 DIAGNOSIS — Z8659 Personal history of other mental and behavioral disorders: Secondary | ICD-10-CM

## 2022-03-08 DIAGNOSIS — F1010 Alcohol abuse, uncomplicated: Secondary | ICD-10-CM | POA: Diagnosis present

## 2022-03-08 MED ORDER — ALUM & MAG HYDROXIDE-SIMETH 200-200-20 MG/5ML PO SUSP
30.0000 mL | ORAL | Status: DC | PRN
  Administered 2022-03-10: 30 mL via ORAL
  Filled 2022-03-08: qty 30

## 2022-03-08 MED ORDER — ACETAMINOPHEN 325 MG PO TABS: 650.0000 mg | ORAL_TABLET | Freq: Four times a day (QID) | ORAL | Status: DC | PRN

## 2022-03-08 MED ORDER — MAGNESIUM HYDROXIDE 400 MG/5ML PO SUSP
30.0000 mL | Freq: Every day | ORAL | Status: DC | PRN
  Administered 2022-03-11: 30 mL via ORAL
  Filled 2022-03-08: qty 30

## 2022-03-08 NOTE — ED Triage Notes (Signed)
Pt presents to [facility] voluntarily, accompanied by his parents with complaint of suicidal ideation with a plan to cut his throat. Pt says he was diagnosed with ADHD as an adult. He reports episodes of depressive symptoms where he stays in bed and isolates. Pt also reports experiencing traumatic events back to back and using alcohol to cope. Pt reports drinking daily and recently drank about 12-18 beers in one day. Pt used delta 8 pen yesterday. Pt reports inability to sleep and feeling like "something bad is going to happen" or "impending doom". Pt denies HI, AVH at this time.

## 2022-03-08 NOTE — ED Provider Notes (Signed)
Facility Based Crisis Admission H&P  Date: 03/09/22 Patient Name: Patrick Alexander MRN: [id number] Chief Complaint:  Chief Complaint  Patient presents with   Suicidal      Diagnoses:  Final diagnoses:  Alcohol abuse  Anxious appearance  Suicidal ideation    HPI: Patrick Alexander,  36 y.o male with a history of ADHD, bipolar disorder presented to [facility] accompanied by his parents, per the patient he is here because he is over stimulated he has been drinking 12 beers a day and not eating at all.  According to patient he was seeing a psychiatrist before but not now he does see a practitioner at [facility] [place] and was taking Adderall medication but he is not taking it anymore.  According to patient I cannot be trusted to be by myself I have had a break-up in the past where my boyfriend cheated on me and it was bad he was narcissistic.  According to patient he is unemployed, patient stated he lives at a church that his father owns and they have a house on the charts that is where he stay at.  TTS notes: Pt presents to [facility] voluntarily, accompanied by his parents with complaint of suicidal ideation with a plan to cut his throat. Pt says he was diagnosed with ADHD as an adult. He reports episodes of depressive symptoms where he stays in bed and isolates. Pt also reports experiencing traumatic events back to back and using alcohol to cope. Pt reports drinking daily and recently drank about 12-18 beers in one day. Pt used delta 8 pen yesterday. Pt reports inability to sleep and feeling like "something bad is going to happen" or "impending doom". Pt denies HI, AVH at this time.   Face-to-face observation of patient, patient is alert and oriented, speech is clear however patient does have some word salad.  Patient is observed in the room sitting down very fidgety, mood is anxious affect is congruent with mood.  Patient denies suicidal ideation, but according to TTS triage notes patient report  SI with plans to cut his throat,  pt denies  HI, AVH or paranoia.  Patient endorsed drinking at least 12 beers a day with some other liquor, patient denies using any illicit drugs at this moment.  According to patient he is over stimulated with all of these lights.  According to patient he does use delta 8, reported he does not sleep that well.  Recommend FBC   PHQ 2-9:     Total Time spent with patient: 20 minutes  Musculoskeletal  Strength & Muscle Tone: within normal limits Gait & Station: normal Patient leans: N/A  Psychiatric Specialty Exam  Presentation General Appearance:  Casual  Eye Contact: Fair  Speech: Clear and Coherent; Pressured  Speech Volume: Increased  Handedness: Right   Mood and Affect  Mood: Anxious  Affect: Labile   Thought Process  Thought Processes: Coherent  Descriptions of Associations:Circumstantial  Orientation:Full (Time, Place and Person)  Thought Content:WDL    Hallucinations:Hallucinations: None  Ideas of Reference:None  Suicidal Thoughts:Suicidal Thoughts: No  Homicidal Thoughts:Homicidal Thoughts: No   Sensorium  Memory: Immediate Fair  Judgment: Poor  Insight: Fair   Chartered certified accountant: Fair  Attention Span: Fair  Recall: Fair  Fund of Knowledge: Fair  Language: Fair   Psychomotor Activity  Psychomotor Activity: Psychomotor Activity: Normal   Assets  Assets: Desire for Improvement; Resilience   Sleep  Sleep: Sleep: Fair Number of Hours of Sleep: 6  Nutritional Assessment (For OBS and FBC admissions only) Has the patient had a weight loss or gain of 10 pounds or more in the last 3 months?: No Has the patient had a decrease in food intake/or appetite?: Yes Does the patient have dental problems?: No Does the patient have eating habits or behaviors that may be indicators of an eating disorder including binging or inducing vomiting?: No Has the patient recently  lost weight without trying?: 0 Has the patient been eating poorly because of a decreased appetite?: 0 Malnutrition Screening Tool Score: 0    Physical Exam HENT:     Head: Normocephalic.  Cardiovascular:     Rate and Rhythm: Normal rate.  Pulmonary:     Effort: Pulmonary effort is normal.  Musculoskeletal:        General: Normal range of motion.  Neurological:     General: No focal deficit present.     Mental Status: He is alert.  Psychiatric:        Mood and Affect: Mood normal.        Behavior: Behavior normal.        Thought Content: Thought content normal.        Judgment: Judgment normal.    Review of Systems  Constitutional: Negative.   HENT: Negative.    Eyes: Negative.   Respiratory: Negative.    Cardiovascular: Negative.   Gastrointestinal: Negative.   Genitourinary: Negative.   Musculoskeletal: Negative.   Skin: Negative.   Neurological: Negative.   Endo/Heme/Allergies: Negative.   Psychiatric/Behavioral:  Positive for depression and substance abuse. The patient is nervous/anxious.     Blood pressure (!) 153/99, pulse 98, temperature 98.8 F (37.1 C), temperature source Oral, resp. rate 20, SpO2 100 %. There is no height or weight on file to calculate BMI.  Past Psychiatric History: ADHD, Bipolar dx   Is the patient at risk to self? No  Has the patient been a risk to self in the past 6 months? No .    Has the patient been a risk to self within the distant past? No   Is the patient a risk to others? No   Has the patient been a risk to others in the past 6 months? No   Has the patient been a risk to others within the distant past? No   Past Medical History:  Past Medical History:  Diagnosis Date   ADD (attention deficit disorder)     Past Surgical History:  Procedure Laterality Date   TONSILLECTOMY     TYMPANOSTOMY TUBE PLACEMENT     WISDOM TOOTH EXTRACTION      Family History:  Family History  Problem Relation Age of Onset   Allergies Mother     Hyperlipidemia Mother    Diabetes Mother    Cancer Mother        endometrial   Allergies Father    Hypertension Father    Stroke Father    Depression Father    Hyperlipidemia Father    Allergies Sister    Hyperlipidemia Sister     Social History:  Social History   Socioeconomic History   Marital status: Single    Spouse name: Not on file   Number of children: Not on file   Years of education: Not on file   Highest education level: Not on file  Occupational History   Not on file  Tobacco Use   Smoking status: Every Day    Packs/day: 0.50    Types: Cigarettes  Last attempt to quit: 01/30/2015    Years since quitting: 7.1   Smokeless tobacco: Not on file  Substance and Sexual Activity   Alcohol use: Yes    Alcohol/week: 0.0 standard drinks of alcohol    Comment: social   Drug use: No   Sexual activity: Yes  Other Topics Concern   Not on file  Social History Narrative   Not on file   Social Determinants of Health   Financial Resource Strain: Not on file  Food Insecurity: Not on file  Transportation Needs: Not on file  Physical Activity: Not on file  Stress: Not on file  Social Connections: Not on file  Intimate Partner Violence: Not on file    SDOH:  SDOH Screenings   Tobacco Use: High Risk (04/29/2020)    Last Labs:  Admission on 03/08/2022  Component Date Value Ref Range Status   SARS Coronavirus 2 by RT PCR 03/09/2022 NEGATIVE  NEGATIVE Final   Comment: (NOTE) SARS-CoV-2 target nucleic acids are NOT DETECTED.  The SARS-CoV-2 RNA is generally detectable in upper respiratory specimens during the acute phase of infection. The lowest concentration of SARS-CoV-2 viral copies this assay can detect is 138 copies/mL. A negative result does not preclude SARS-Cov-2 infection and should not be used as the sole basis for treatment or other patient management decisions. A negative result may occur with  improper specimen collection/handling, submission  of specimen other than nasopharyngeal swab, presence of viral mutation(s) within the areas targeted by this assay, and inadequate number of viral copies(<138 copies/mL). A negative result must be combined with clinical observations, patient history, and epidemiological information. The expected result is Negative.  Fact Sheet for Patients:  BloggerCourse.com  Fact Sheet for Healthcare Providers:  SeriousBroker.it  This test is no                          t yet approved or cleared by the [place] FDA and  has been authorized for detection and/or diagnosis of SARS-CoV-2 by FDA under an Emergency Use Authorization (EUA). This EUA will remain  in effect (meaning this test can be used) for the duration of the COVID-19 declaration under Section 564(b)(1) of the Act, 21 U.S.C.section 360bbb-3(b)(1), unless the authorization is terminated  or revoked sooner.       Influenza A by PCR 03/09/2022 NEGATIVE  NEGATIVE Final   Influenza B by PCR 03/09/2022 NEGATIVE  NEGATIVE Final   Comment: (NOTE) The Xpert Xpress SARS-CoV-2/FLU/RSV plus assay is intended as an aid in the diagnosis of influenza from Nasopharyngeal swab specimens and should not be used as a sole basis for treatment. Nasal washings and aspirates are unacceptable for Xpert Xpress SARS-CoV-2/FLU/RSV testing.  Fact Sheet for Patients: BloggerCourse.com  Fact Sheet for Healthcare Providers: SeriousBroker.it  This test is not yet approved or cleared by the [place] FDA and has been authorized for detection and/or diagnosis of SARS-CoV-2 by FDA under an Emergency Use Authorization (EUA). This EUA will remain in effect (meaning this test can be used) for the duration of the COVID-19 declaration under Section 564(b)(1) of the Act, 21 U.S.C. section 360bbb-3(b)(1), unless the authorization is terminated  or revoked.  Performed at [facility], [address] [place], [place] [address]    WBC 03/09/2022 7.8  4.0 - 10.5 K/uL Final   RBC 03/09/2022 4.09 (L)  4.22 - 5.81 MIL/uL Final   Hemoglobin 03/09/2022 13.6  13.0 - 17.0 g/dL Final  HCT 03/09/2022 38.5 (L)  39.0 - 52.0 % Final   MCV 03/09/2022 94.1  80.0 - 100.0 fL Final   MCH 03/09/2022 33.3  26.0 - 34.0 pg Final   MCHC 03/09/2022 35.3  30.0 - 36.0 g/dL Final   RDW 47/82/956211/21/2023 12.0  11.5 - 15.5 % Final   Platelets 03/09/2022 447 (H)  150 - 400 K/uL Final   nRBC 03/09/2022 0.0  0.0 - 0.2 % Final   Neutrophils Relative % 03/09/2022 54  % Final   Neutro Abs 03/09/2022 4.2  1.7 - 7.7 K/uL Final   Lymphocytes Relative 03/09/2022 35  % Final   Lymphs Abs 03/09/2022 2.8  0.7 - 4.0 K/uL Final   Monocytes Relative 03/09/2022 9  % Final   Monocytes Absolute 03/09/2022 0.7  0.1 - 1.0 K/uL Final   Eosinophils Relative 03/09/2022 1  % Final   Eosinophils Absolute 03/09/2022 0.1  0.0 - 0.5 K/uL Final   Basophils Relative 03/09/2022 1  % Final   Basophils Absolute 03/09/2022 0.0  0.0 - 0.1 K/uL Final   Immature Granulocytes 03/09/2022 0  % Final   Abs Immature Granulocytes 03/09/2022 0.01  0.00 - 0.07 K/uL Final   Performed at [facility], [address] [place], [place] [address]   Sodium 03/09/2022 141  135 - 145 mmol/L Final   Potassium 03/09/2022 3.9  3.5 - 5.1 mmol/L Final   Chloride 03/09/2022 102  98 - 111 mmol/L Final   CO2 03/09/2022 26  22 - 32 mmol/L Final   Glucose, Bld 03/09/2022 91  70 - 99 mg/dL Final   Glucose reference range applies only to samples taken after fasting for at least 8 hours.   BUN 03/09/2022 15  6 - 20 mg/dL Final   Creatinine, Ser 03/09/2022 0.97  0.61 - 1.24 mg/dL Final   Calcium 57/84/696211/21/2023 9.4  8.9 - 10.3 mg/dL Final   Total Protein 95/28/413211/21/2023 6.3 (L)  6.5 - 8.1 g/dL Final   Albumin 44/01/027211/21/2023 4.3  3.5 - 5.0 g/dL Final   AST 53/66/440311/21/2023 32  15 - 41 U/L Final   ALT 03/09/2022 31  0 - 44 U/L  Final   Alkaline Phosphatase 03/09/2022 48  38 - 126 U/L Final   Total Bilirubin 03/09/2022 0.6  0.3 - 1.2 mg/dL Final   GFR, Estimated 03/09/2022 >60  >60 mL/min Final   Comment: (NOTE) Calculated using the CKD-EPI Creatinine Equation (2021)    Anion gap 03/09/2022 13  5 - 15 Final   Performed at [facility], [address] [place], [place] [address]   Hgb A1c MFr Bld 03/09/2022 5.3  4.8 - 5.6 % Final   Comment: (NOTE) Pre diabetes:          5.7%-6.4%  Diabetes:              >6.4%  Glycemic control for   <7.0% adults with diabetes    Mean Plasma Glucose 03/09/2022 105.41  mg/dL Final   Performed at [facility], [address] [place], [place] [address]   Alcohol, Ethyl (B) 03/09/2022 <10  <10 mg/dL Final   Comment: (NOTE) Lowest detectable limit for serum alcohol is 10 mg/dL.  For medical purposes only. Performed at [facility], [address] [place], [place] [address]    Cholesterol 03/09/2022 192  0 - 200 mg/dL Final   Triglycerides 32/95/188411/21/2023 104  <150 mg/dL Final   HDL 16/60/630111/21/2023 73  >40 mg/dL Final  Total CHOL/HDL Ratio 03/09/2022 2.6  RATIO Final   VLDL 03/09/2022 21  0 - 40 mg/dL Final   LDL Cholesterol 03/09/2022 98  0 - 99 mg/dL Final   Comment:        Total Cholesterol/HDL:CHD Risk Coronary Heart Disease Risk Table                     Men   Women  1/2 Average Risk   3.4   3.3  Average Risk       5.0   4.4  2 X Average Risk   9.6   7.1  3 X Average Risk  23.4   11.0        Use the calculated Patient Ratio above and the CHD Risk Table to determine the patient's CHD Risk.        ATP III CLASSIFICATION (LDL):  <100     mg/dL   Optimal  151-761  mg/dL   Near or Above                    Optimal  130-159  mg/dL   Borderline  607-371  mg/dL   High  >062     mg/dL   Very High Performed at [facility], [address] [place], [place] [address]    TSH 03/09/2022 2.539  0.350 - 4.500 uIU/mL Final   Comment: Performed by a 3rd  Generation assay with a functional sensitivity of <=0.01 uIU/mL. Performed at [facility], [address], [place], [place] [address]    POC Amphetamine UR 03/09/2022 None Detected  NONE DETECTED (Cut Off Level 1000 ng/mL) Final   POC Secobarbital (BAR) 03/09/2022 None Detected  NONE DETECTED (Cut Off Level 300 ng/mL) Final   POC Buprenorphine (BUP) 03/09/2022 None Detected  NONE DETECTED (Cut Off Level 10 ng/mL) Final   POC Oxazepam (BZO) 03/09/2022 None Detected  NONE DETECTED (Cut Off Level 300 ng/mL) Final   POC Cocaine UR 03/09/2022 None Detected  NONE DETECTED (Cut Off Level 300 ng/mL) Final   POC Methamphetamine UR 03/09/2022 None Detected  NONE DETECTED (Cut Off Level 1000 ng/mL) Final   POC Morphine 03/09/2022 None Detected  NONE DETECTED (Cut Off Level 300 ng/mL) Final   POC Methadone UR 03/09/2022 None Detected  NONE DETECTED (Cut Off Level 300 ng/mL) Final   POC Oxycodone UR 03/09/2022 None Detected  NONE DETECTED (Cut Off Level 100 ng/mL) Final   POC Marijuana UR 03/09/2022 Positive (A)  NONE DETECTED (Cut Off Level 50 ng/mL) Final   SARSCOV2ONAVIRUS 2 AG 03/09/2022 NEGATIVE  NEGATIVE Final   Comment: (NOTE) SARS-CoV-2 antigen NOT DETECTED.   Negative results are presumptive.  Negative results do not preclude SARS-CoV-2 infection and should not be used as the sole basis for treatment or other patient management decisions, including infection  control decisions, particularly in the presence of clinical signs and  symptoms consistent with COVID-19, or in those who have been in contact with the virus.  Negative results must be combined with clinical observations, patient history, and epidemiological information. The expected result is Negative.  Fact Sheet for Patients: https://www.jennings-kim.com/  Fact Sheet for Healthcare Providers: https://alexander-rogers.biz/  This test is not yet approved or cleared by the [place] FDA and  has  been authorized for detection and/or diagnosis of SARS-CoV-2 by FDA under an Emergency Use Authorization (EUA).  This EUA will remain in effect (meaning this test can be used) for the duration  of  the COV                          ID-19 declaration under Section 564(b)(1) of the Act, 21 U.S.C. section 360bbb-3(b)(1), unless the authorization is terminated or revoked sooner.      Allergies: Amoxicillin and Penicillins  PTA Medications: (Not in a hospital admission)   Long Term Goals: Improvement in symptoms so as ready for discharge  Short Term Goals: Patient will attend at least of 50% of the groups daily., Pt will complete the PHQ9 on admission, day 3 and discharge., Patient will participate in completing the [place] Suicide Severity Rating Scale, Patient will score a low risk of violence for 24 hours prior to discharge, and Patient will take medications as prescribed daily.  Medical Decision Making  FBC   Lab Orders         Resp Panel by RT-PCR (Flu A&B, Covid) Anterior Nasal Swab         CBC with Differential/Platelet         Comprehensive metabolic panel         Hemoglobin A1c         Ethanol         Lipid panel         TSH         POCT Urine Drug Screen - (I-Screen)         POC SARS Coronavirus 2 Ag      Meds ordered this encounter  Medications   acetaminophen (TYLENOL) tablet 650 mg   alum & mag hydroxide-simeth (MAALOX/MYLANTA) 200-200-20 MG/5ML suspension 30 mL   magnesium hydroxide (MILK OF MAGNESIA) suspension 30 mL   thiamine (VITAMIN B1) injection 100 mg   thiamine (VITAMIN B1) tablet 100 mg   multivitamin with minerals tablet 1 tablet   LORazepam (ATIVAN) tablet 1 mg   hydrOXYzine (ATARAX) tablet 25 mg   loperamide (IMODIUM) capsule 2-4 mg   ondansetron (ZOFRAN-ODT) disintegrating tablet 4 mg   FOLLOWED BY Linked Order Group    LORazepam (ATIVAN) tablet 1 mg    LORazepam (ATIVAN) tablet 1 mg    LORazepam (ATIVAN) tablet 1 mg    LORazepam (ATIVAN) tablet  1 mg         Recommendations  Based on my evaluation the patient appears to have an emergency medical condition for which I recommend the patient be transferred to the emergency department for further evaluation.  Sindy Guadeloupe, NP 03/09/22  5:08 AM

## 2022-03-09 ENCOUNTER — Encounter (HOSPITAL_COMMUNITY): Payer: Self-pay | Admitting: Psychiatry

## 2022-03-09 ENCOUNTER — Encounter (HOSPITAL_COMMUNITY): Payer: Self-pay

## 2022-03-09 DIAGNOSIS — F902 Attention-deficit hyperactivity disorder, combined type: Secondary | ICD-10-CM | POA: Diagnosis not present

## 2022-03-09 DIAGNOSIS — Z1152 Encounter for screening for COVID-19: Secondary | ICD-10-CM | POA: Diagnosis not present

## 2022-03-09 DIAGNOSIS — F4310 Post-traumatic stress disorder, unspecified: Secondary | ICD-10-CM | POA: Diagnosis present

## 2022-03-09 DIAGNOSIS — F509 Eating disorder, unspecified: Secondary | ICD-10-CM | POA: Diagnosis present

## 2022-03-09 DIAGNOSIS — Z9152 Personal history of nonsuicidal self-harm: Secondary | ICD-10-CM

## 2022-03-09 DIAGNOSIS — F909 Attention-deficit hyperactivity disorder, unspecified type: Secondary | ICD-10-CM | POA: Insufficient documentation

## 2022-03-09 DIAGNOSIS — Z8659 Personal history of other mental and behavioral disorders: Secondary | ICD-10-CM

## 2022-03-09 DIAGNOSIS — F1210 Cannabis abuse, uncomplicated: Secondary | ICD-10-CM | POA: Diagnosis present

## 2022-03-09 DIAGNOSIS — F1020 Alcohol dependence, uncomplicated: Secondary | ICD-10-CM | POA: Diagnosis not present

## 2022-03-09 DIAGNOSIS — F322 Major depressive disorder, single episode, severe without psychotic features: Secondary | ICD-10-CM | POA: Diagnosis present

## 2022-03-09 HISTORY — DX: Personal history of other mental and behavioral disorders: Z86.59

## 2022-03-09 HISTORY — DX: Personal history of nonsuicidal self-harm: Z91.52

## 2022-03-09 LAB — POCT URINE DRUG SCREEN - MANUAL ENTRY (I-SCREEN)
POC Amphetamine UR: NOT DETECTED
POC Buprenorphine (BUP): NOT DETECTED
POC Cocaine UR: NOT DETECTED
POC Marijuana UR: POSITIVE — AB
POC Methadone UR: NOT DETECTED
POC Methamphetamine UR: NOT DETECTED
POC Morphine: NOT DETECTED
POC Oxazepam (BZO): NOT DETECTED
POC Oxycodone UR: NOT DETECTED
POC Secobarbital (BAR): NOT DETECTED

## 2022-03-09 LAB — RESP PANEL BY RT-PCR (FLU A&B, COVID) ARPGX2
Influenza A by PCR: NEGATIVE
Influenza B by PCR: NEGATIVE
SARS Coronavirus 2 by RT PCR: NEGATIVE

## 2022-03-09 LAB — CBC WITH DIFFERENTIAL/PLATELET
Abs Immature Granulocytes: 0.01 10*3/uL (ref 0.00–0.07)
Basophils Absolute: 0 10*3/uL (ref 0.0–0.1)
Basophils Relative: 1 %
Eosinophils Absolute: 0.1 10*3/uL (ref 0.0–0.5)
Eosinophils Relative: 1 %
HCT: 38.5 % — ABNORMAL LOW (ref 39.0–52.0)
Hemoglobin: 13.6 g/dL (ref 13.0–17.0)
Immature Granulocytes: 0 %
Lymphocytes Relative: 35 %
Lymphs Abs: 2.8 10*3/uL (ref 0.7–4.0)
MCH: 33.3 pg (ref 26.0–34.0)
MCHC: 35.3 g/dL (ref 30.0–36.0)
MCV: 94.1 fL (ref 80.0–100.0)
Monocytes Absolute: 0.7 10*3/uL (ref 0.1–1.0)
Monocytes Relative: 9 %
Neutro Abs: 4.2 10*3/uL (ref 1.7–7.7)
Neutrophils Relative %: 54 %
Platelets: 447 10*3/uL — ABNORMAL HIGH (ref 150–400)
RBC: 4.09 MIL/uL — ABNORMAL LOW (ref 4.22–5.81)
RDW: 12 % (ref 11.5–15.5)
WBC: 7.8 10*3/uL (ref 4.0–10.5)
nRBC: 0 % (ref 0.0–0.2)

## 2022-03-09 LAB — COMPREHENSIVE METABOLIC PANEL
ALT: 31 U/L (ref 0–44)
AST: 32 U/L (ref 15–41)
Albumin: 4.3 g/dL (ref 3.5–5.0)
Alkaline Phosphatase: 48 U/L (ref 38–126)
Anion gap: 13 (ref 5–15)
BUN: 15 mg/dL (ref 6–20)
CO2: 26 mmol/L (ref 22–32)
Calcium: 9.4 mg/dL (ref 8.9–10.3)
Chloride: 102 mmol/L (ref 98–111)
Creatinine, Ser: 0.97 mg/dL (ref 0.61–1.24)
GFR, Estimated: >60 mL/min (ref >60)
Glucose, Bld: 91 mg/dL (ref 70–99)
Potassium: 3.9 mmol/L (ref 3.5–5.1)
Sodium: 141 mmol/L (ref 135–145)
Total Bilirubin: 0.6 mg/dL (ref 0.3–1.2)
Total Protein: 6.3 g/dL — ABNORMAL LOW (ref 6.5–8.1)

## 2022-03-09 LAB — POC SARS CORONAVIRUS 2 AG: SARSCOV2ONAVIRUS 2 AG: NEGATIVE

## 2022-03-09 LAB — HEMOGLOBIN A1C
Hgb A1c MFr Bld: 5.3 % (ref 4.8–5.6)
Mean Plasma Glucose: 105.41 mg/dL

## 2022-03-09 LAB — LIPID PANEL
Cholesterol: 192 mg/dL (ref 0–200)
HDL: 73 mg/dL (ref >40)
LDL Cholesterol: 98 mg/dL (ref 0–99)
Total CHOL/HDL Ratio: 2.6 RATIO
Triglycerides: 104 mg/dL (ref <150)
VLDL: 21 mg/dL (ref 0–40)

## 2022-03-09 LAB — ETHANOL: Alcohol, Ethyl (B): <10 mg/dL (ref <10)

## 2022-03-09 LAB — TSH: TSH: 2.539 u[IU]/mL (ref 0.350–4.500)

## 2022-03-09 MED ORDER — SERTRALINE HCL 25 MG PO TABS
25.0000 mg | ORAL_TABLET | Freq: Every morning | ORAL | Status: AC
  Administered 2022-03-10: 25 mg via ORAL
  Filled 2022-03-09: qty 1

## 2022-03-09 MED ORDER — CHLORDIAZEPOXIDE HCL 25 MG PO CAPS
25.0000 mg | ORAL_CAPSULE | Freq: Four times a day (QID) | ORAL | Status: AC
  Administered 2022-03-09 – 2022-03-10 (×6): 25 mg via ORAL
  Filled 2022-03-09 (×6): qty 1

## 2022-03-09 MED ORDER — CHLORDIAZEPOXIDE HCL 25 MG PO CAPS
25.0000 mg | ORAL_CAPSULE | Freq: Three times a day (TID) | ORAL | Status: AC
  Administered 2022-03-10 – 2022-03-11 (×3): 25 mg via ORAL
  Filled 2022-03-09 (×3): qty 1

## 2022-03-09 MED ORDER — HYDROXYZINE HCL 25 MG PO TABS
25.0000 mg | ORAL_TABLET | Freq: Four times a day (QID) | ORAL | Status: AC | PRN
  Administered 2022-03-09 – 2022-03-10 (×3): 25 mg via ORAL
  Filled 2022-03-09 (×3): qty 1

## 2022-03-09 MED ORDER — SERTRALINE HCL 50 MG PO TABS
50.0000 mg | ORAL_TABLET | Freq: Every morning | ORAL | Status: DC
  Administered 2022-03-11 – 2022-03-15 (×5): 50 mg via ORAL
  Filled 2022-03-09 (×4): qty 1
  Filled 2022-03-09 (×2): qty 14
  Filled 2022-03-09: qty 1

## 2022-03-09 MED ORDER — THIAMINE MONONITRATE 100 MG PO TABS
100.0000 mg | ORAL_TABLET | Freq: Every day | ORAL | Status: DC
  Administered 2022-03-10 – 2022-03-15 (×6): 100 mg via ORAL
  Filled 2022-03-09 (×6): qty 1
  Filled 2022-03-09: qty 14

## 2022-03-09 MED ORDER — CHLORDIAZEPOXIDE HCL 25 MG PO CAPS
25.0000 mg | ORAL_CAPSULE | ORAL | Status: AC
  Administered 2022-03-11 – 2022-03-12 (×2): 25 mg via ORAL
  Filled 2022-03-09 (×2): qty 1

## 2022-03-09 MED ORDER — CHLORDIAZEPOXIDE HCL 25 MG PO CAPS: 25.0000 mg | ORAL_CAPSULE | Freq: Four times a day (QID) | ORAL | Status: AC | PRN

## 2022-03-09 MED ORDER — MELATONIN 3 MG PO TABS
3.0000 mg | ORAL_TABLET | Freq: Once | ORAL | Status: AC | PRN
  Administered 2022-03-09: 3 mg via ORAL
  Filled 2022-03-09: qty 1

## 2022-03-09 MED ORDER — ENSURE ENLIVE PO LIQD: 237.0000 mL | ORAL | Status: DC | PRN

## 2022-03-09 MED ORDER — LOPERAMIDE HCL 2 MG PO CAPS: 2.0000 mg | ORAL_CAPSULE | ORAL | Status: AC | PRN

## 2022-03-09 MED ORDER — ONDANSETRON 4 MG PO TBDP: 4.0000 mg | ORAL_TABLET | Freq: Four times a day (QID) | ORAL | Status: AC | PRN

## 2022-03-09 MED ORDER — ADULT MULTIVITAMIN W/MINERALS CH
1.0000 | ORAL_TABLET | Freq: Every day | ORAL | Status: DC
  Administered 2022-03-09 – 2022-03-15 (×7): 1 via ORAL
  Filled 2022-03-09 (×3): qty 1
  Filled 2022-03-09: qty 14
  Filled 2022-03-09 (×4): qty 1

## 2022-03-09 MED ORDER — LORAZEPAM 1 MG PO TABS: 1.0000 mg | ORAL_TABLET | Freq: Two times a day (BID) | ORAL | Status: DC

## 2022-03-09 MED ORDER — LORAZEPAM 1 MG PO TABS
1.0000 mg | ORAL_TABLET | Freq: Four times a day (QID) | ORAL | Status: DC
  Administered 2022-03-09: 1 mg via ORAL
  Filled 2022-03-09: qty 1

## 2022-03-09 MED ORDER — LORAZEPAM 1 MG PO TABS: 1.0000 mg | ORAL_TABLET | Freq: Three times a day (TID) | ORAL | Status: DC

## 2022-03-09 MED ORDER — THIAMINE HCL 100 MG/ML IJ SOLN
100.0000 mg | Freq: Once | INTRAMUSCULAR | Status: AC
  Administered 2022-03-09: 100 mg via INTRAMUSCULAR
  Filled 2022-03-09: qty 2

## 2022-03-09 MED ORDER — LORAZEPAM 1 MG PO TABS: 1.0000 mg | ORAL_TABLET | Freq: Every day | ORAL | Status: DC

## 2022-03-09 MED ORDER — CHLORDIAZEPOXIDE HCL 25 MG PO CAPS
25.0000 mg | ORAL_CAPSULE | Freq: Every day | ORAL | Status: AC
  Administered 2022-03-12: 25 mg via ORAL
  Filled 2022-03-09: qty 1

## 2022-03-09 MED ORDER — LORAZEPAM 1 MG PO TABS: 1.0000 mg | ORAL_TABLET | Freq: Four times a day (QID) | ORAL | Status: DC | PRN

## 2022-03-09 NOTE — Progress Notes (Signed)
LCSW contacted patient's Mother Dajon Lazar [phone] to gain collateral information. LCSW explained role and reason for call and mother expressed understanding. Mother provided LCSW with the following information:   Mother reports [facility] two and a half years. Mother reports the patient typically does really well with keeping the home clean and keeping himself busy. However, mother reports over the last few months the patient has had a lack of motivation to do anything as he has been consumed by things of his past. Mother reports daily alcohol and cigarettes use that she is aware of. Mother also reports that she believes that the patient has done cocaine in the past as he use to live in an environment where it was easily accessible. Mother reports patient has always been a drinker, however reports the drinking has gotten worse over the past two and a half years. Mother reports she has seen a drastic increase over the past 6 months. Mother reports an increase with smoking tobacco as well. Mother reports that the patient mentioned to her and his father that he has felt guilty for having to depend on people to support him. Mother reports the patient mentioned yesterday that he was tired or drinking and tired of smoking, and wanted to get help. Mother reports the patient is more open with his father, however reports the patient was very transparent with them both on yesterday. Mother reports her biggest concern is the patient's safety. Mother reports this was the first time the patient ever mentioned to them that "one day they were going to find him in a bath tub full of blood". Mother reports she just wants to ensure that the patient will be safe, and that he gets the help he needs to support himself.   Mother reports the patient's baseline varies depending on environment. Mother reports the church family and friends love patient, and reports he does  well with private conversations. Mother reports the patient does not do well when he is alone because he stays fixated on the things of his past. Mother reported incidents of being bullied in his preteen years and reports one encounter of cutting back in high school. Mother reports she also has seen a difference in his behavior since his break up with an ex-boyfriend a few years ago. Mother reports that she is not sure if the patient has ever been diagnosed with anything other than ADD. Mother reports that she believes the patient struggles with obsessive compulsiveness as she has observed different behaviors from the patient. Mother reports the patient has a history of just cutting his hair off and has been doing that since high school. Mother reports she has recently discovered that the patient has been plucking his eyebrow hair out. Mother reports only one prior inpatient MH admission back in high school for cutting. Mother reports one encounter of receiving substance abuse treatment in the past. However, reports this was about 4-5 years ago at a treatment facility in [place] due to a DUI after relationship had gone bad. Mother denies any other treatment.   Mother reports family is very supportive and is willing to assist with the discharge process as needed. LCSW informed mother of patient's current goal to seek residential treatment at this time, and mother is supportive of plan. Mother aware that referrals will be sent for review and an update will be provided as received. Mother expressed appreciation of LCSW assistance. No other needs  were reported at this time.   LCSW will continue to follow and provide support to patient while on FBC unit.   Fernande Boyden, LCSW Clinical Social Worker [facility] Ph: [phone]
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
## 2022-03-09 NOTE — ED Notes (Signed)
Pt requested for medication that will help him sleep.

## 2022-03-09 NOTE — ED Notes (Signed)
Pt admitted to [facility] endorsing SI, with plan to cut his throat.. Patient denies SI,HI,AVH at present. Patient was cooperative during the admission assessment. Skin assessment complete. Patient oriented to unit and unit rules. Meal and drinks offered to patient.  Patient verbalized agreement to treatment plans. Patient verbally contracts for safety while hospitalized. Will monitor for safety.

## 2022-03-09 NOTE — BH IP Treatment Plan (Signed)
Interdisciplinary Treatment and Diagnostic Plan Update  03/09/2022 Time of Session: 10:30AM Patrick Alexander MRN: [id number]  Diagnosis:  Final diagnoses:  Alcohol use disorder, severe, dependence (HCC)  Anxious appearance  Suicidal ideation     Current Medications:  Current Facility-Administered Medications  Medication Dose Route Frequency Provider Last Rate Last Admin   acetaminophen (TYLENOL) tablet 650 mg  650 mg Oral Q6H PRN Sindy Guadeloupe, NP       alum & mag hydroxide-simeth (MAALOX/MYLANTA) 200-200-20 MG/5ML suspension 30 mL  30 mL Oral Q4H PRN Sindy Guadeloupe, NP       chlordiazePOXIDE (LIBRIUM) capsule 25 mg  25 mg Oral Q6H PRN Princess Bruins, DO       chlordiazePOXIDE (LIBRIUM) capsule 25 mg  25 mg Oral QID Princess Bruins, DO   25 mg at 03/09/22 1354   Followed by   Melene Muller ON 03/10/2022] chlordiazePOXIDE (LIBRIUM) capsule 25 mg  25 mg Oral TID Princess Bruins, DO       Followed by   Melene Muller ON 03/11/2022] chlordiazePOXIDE (LIBRIUM) capsule 25 mg  25 mg Oral Doreatha Martin, DO       Followed by   Melene Muller ON 03/12/2022] chlordiazePOXIDE (LIBRIUM) capsule 25 mg  25 mg Oral Daily Princess Bruins, DO       feeding supplement (ENSURE ENLIVE / ENSURE PLUS) liquid 237 mL  237 mL Oral PRN Princess Bruins, DO       hydrOXYzine (ATARAX) tablet 25 mg  25 mg Oral Q6H PRN Sindy Guadeloupe, NP       loperamide (IMODIUM) capsule 2-4 mg  2-4 mg Oral PRN Sindy Guadeloupe, NP       magnesium hydroxide (MILK OF MAGNESIA) suspension 30 mL  30 mL Oral Daily PRN Sindy Guadeloupe, NP       multivitamin with minerals tablet 1 tablet  1 tablet Oral Daily Sindy Guadeloupe, NP   1 tablet at 03/09/22 1007   ondansetron (ZOFRAN-ODT) disintegrating tablet 4 mg  4 mg Oral Q6H PRN Sindy Guadeloupe, NP       Melene Muller ON 03/10/2022] sertraline (ZOLOFT) tablet 25 mg  25 mg Oral q AM Princess Bruins, DO       [START ON 03/11/2022] sertraline (ZOLOFT) tablet 50 mg  50 mg Oral q AM Princess Bruins, DO       [START ON 03/10/2022] thiamine  (VITAMIN B1) tablet 100 mg  100 mg Oral Daily Sindy Guadeloupe, NP       Current Outpatient Medications  Medication Sig Dispense Refill   ibuprofen (ADVIL) 200 MG tablet Take 400 mg by mouth every 6 (six) hours as needed (For back pain).     OVER THE COUNTER MEDICATION Take 1 tablet by mouth daily. Vitamin B-12 Gummy     OVER THE COUNTER MEDICATION Place 1 drop into both eyes 4 (four) times daily as needed (For eye irritation). Rohto Eye Drops     PTA Medications: Prior to Admission medications   Medication Sig Start Date End Date Taking? Authorizing Provider  ibuprofen (ADVIL) 200 MG tablet Take 400 mg by mouth every 6 (six) hours as needed (For back pain).   Yes [provider]  OVER THE COUNTER MEDICATION Take 1 tablet by mouth daily. Vitamin B-12 Gummy   Yes [provider]  OVER THE COUNTER MEDICATION Place 1 drop into both eyes 4 (four) times daily as needed (For eye irritation). Rohto Eye Drops   Yes [provider]    Patient Stressors: Financial difficulties   Loss of  employment, relationships   Medication change or noncompliance   Occupational concerns   Substance abuse   Traumatic event    Patient Strengths: Ability for insight  Average or above average intelligence  Capable of independent living  Communication skills  General fund of knowledge  Motivation for treatment/growth  Physical Health  Special hobby/interest  Supportive family/friends  Work skills   Treatment Modalities: Medication Management, Group therapy, Case management,  1 to 1 session with clinician, Psychoeducation, Recreational therapy.   Physician Treatment Plan for Primary and Secondary Diagnosis:  Final diagnoses:  Alcohol use disorder, severe, dependence ([place])  Anxious appearance  Suicidal ideation   Long Term Goal(s): Improvement in symptoms so as ready for discharge  Short Term Goals: Patient will attend at least of 50% of the groups daily. Pt will complete the  PHQ9 on admission, day 3 and discharge. Patient will participate in completing the [place] Patient will score a low risk of violence for 24 hours prior to discharge Patient will take medications as prescribed daily.  Medication Management: Evaluate patient's response, side effects, and tolerance of medication regimen.  Therapeutic Interventions: 1 to 1 sessions, Unit Group sessions and Medication administration.  Evaluation of Outcomes: Progressing  LCSW Treatment Plan for Primary Diagnosis:  Final diagnoses:  Alcohol use disorder, severe, dependence ([place])  Anxious appearance  Suicidal ideation    Long Term Goal(s): Safe transition to appropriate next level of care at discharge.  Short Term Goals: Facilitate acceptance of mental health diagnosis and concerns through verbal commitment to aftercare plan and appointments at discharge., Patient will identify one social support prior to discharge to aid in patient's recovery., Patient will attend AA/NA groups as scheduled., Identify minimum of 2 triggers associated with mental health/substance abuse issues with treatment team members., and Increase skills for wellness and recovery by attending 50% of scheduled groups.  Therapeutic Interventions: Assess for all discharge needs, 1 to 1 time with Education officer, museum, Explore available resources and support systems, Assess for adequacy in community support network, Educate family and significant other(s) on suicide prevention, Complete Psychosocial Assessment, Interpersonal group therapy.  Evaluation of Outcomes: Progressing   Progress in Treatment: Attending groups: Yes. Participating in groups: Yes. Taking medication as prescribed: Yes. Toleration medication: Yes. Family/Significant other contact made: Yes, individual(s) contacted:  Patient's Mother Jenny Reichmann Delorenzo  Patient understands diagnosis: Yes. Discussing patient identified problems/goals with staff:  Yes. Medical problems stabilized or resolved: Yes. Denies suicidal/homicidal ideation: Yes. Issues/concerns per patient self-inventory: Yes. Other: substance use, financial concerns, mental health needs  New problem(s) identified: No, Describe:  other than reported on admission  New Short Term/Long Term Goal(s): Safe transition to appropriate next level of care at discharge, Engage patient in therapeutic group addressing interpersonal concerns. Engage patient in aftercare planning with referrals and resources, Increase ability to appropriately verbalize feelings, Facilitate acceptance of mental health diagnosis and concerns and Identify triggers associated with mental health/substance abuse issues.   Patient Goals:  Patient is seeking short term residential placement at this time.   Discharge Plan or Barriers: LCSW will send referrals out for review and will follow up to provide updates as received.   Reason for Continuation of Hospitalization: Anxiety Depression Medication stabilization  Estimated Length of Stay: 3-5 days  Last 3 [place] Suicide Severity Risk Score:   Last PHQ 2/9 Scores:    04/23/2015    8:44 AM  Depression screen PHQ 2/9  Decreased Interest 0  Down, Depressed, Hopeless 0  PHQ -  2 Score 0    Scribe for Treatment Team: Gigi Gin 03/09/2022 3:10 PM

## 2022-03-09 NOTE — ED Provider Notes (Signed)
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 [phone]

## 2022-03-09 NOTE — ED Notes (Signed)
Patient got up for breakfast and then quickly returned to room and bed.  No complaints presently and no evidence of withdrawal at this time.  Encouraged to make needs known will monitor.

## 2022-03-09 NOTE — ED Notes (Signed)
Pt sleeping@this time. Breathing even and unlabored. Will continue to monitor for safety 

## 2022-03-09 NOTE — Discharge Instructions (Addendum)
Dear Patrick Alexander,  Most effective treatment for your mental health disease involves BOTH a psychiatrist AND a therapist Psychiatrist to manage medications Therapist to help identify personal goals, barriers from those goals, and plan to achieve those goals by understanding emotions Please make regular appointments with an outpatient psychiatrist and other doctors once you leave the hospital (if any, otherwise, please see below for resources to make an appointment).  For therapy outside the hospital, please ask for these specific types of therapy: DBT ________________________________________________________  SAFETY CRISIS  Dial 988 for National Suicide & Crisis Lifeline    Text (863) 536-2891 for Crisis Text Line:     [facility] URGENT CARE:  [address] FIRST FLOOR.  [place], [place] [address].  [phone]  Mobile Crisis Response Teams Listed by counties in vicinity of [facility] providers [facility] [phone] [facility] [phone] [facility] [phone] [facility] [phone] [place]                * [facility] [phone]                * [facility] [phone] [facility] [phone] [facility]  [phone] * [facility] [phone] ________________________________________________________  To see which pharmacy near you is the CHEAPEST for certain medications, please use GoodRx. It is free website and has a free phone app.    Also consider looking at [facility] $4.00 or [facility]'s $7.00 prescription list. Both are free to view if googled "[facility] $4 prescription" and "public's $7 prescription". These are set prices, no insurance required. [facility] low cost medications: $4-$15 for 30days  prescriptions or $10-$38 for 90days prescriptions  ________________________________________________________  Difficulties with sleep?   Can also use this free app for insomnia called CBT-I. Let your doctors and therapists know so they can help with extra tips and tricks or for guidance and accountability. NO ADDS on the app.     ________________________________________________________  Non-Emergent / Urgent  [facility] [address] SECOND FLOOR [place], [place] [address] [phone] OUTPATIENT Walk-in information: Please note, all walk-ins are first come & first serve, with limited number of availability.  Please note that to be eligible for services you must bring: ID or a piece of mail with your name [facility] address  Therapist for therapy:  Monday & Wednesdays: Please ARRIVE at 7:15 AM for registration Will START at 8:00 AM Every 1st & 2nd Friday of the month: Please ARRIVE at 10:15 AM for registration Will START at 1 PM - 5 PM  Psychiatrist for medication management: Monday - Friday:  Please ARRIVE at 7:15 AM for registration Will START at 8:00 AM  Regretfully, due to limited availability, please be aware that you may not been seen on the same day as walk-in. Please consider making an appoint or try again. Thank you for your patience and understanding.    If you are interested in PHP (Partial Hospitalization Program) This appointment will last approximately 1 hour and will be virtual via Webex. PHP is a partial group therapy programs that runs Mon-Fri from 9 AM-1 PM.  Please download the [employer] app prior to the appointment.  If you need to cancel or reschedule, please call [phone].

## 2022-03-09 NOTE — Tx Team (Signed)
LCSW met with patient to assess current mood, affect, physical state, and inquire about needs/goals while here in [facility] and after discharge. Patient reports he presented to the [facility] accompanied by his parents for SI with a plan to cut his throat. Patient reports feelings of hopelessness, depression, SI, and worthlessness for the past week. Patient reported going to his storage unit and cleaning it out because he was just over everything constantly piling up in his life "one thing after another". Patient reports he has not been sleeping well and reports a decrease in his appetite due to drinking. Patient reports he has been socially drinking for a few years now, however, reports an increase in his drinking over past 6 months due to life stressors. Patient reports he currently drinks about 12-18 beers a day, and reports he does this in substitution of eating. Patient reports he counts the calories that he intakes and figures that "it's not such a bad things since he still uses the bathroom regularly". Patient was fixated on believing that if he ate regularly, something bad was going to happen to him. Patient reports that he drinks to avoid the feeling of thinking something bad would happen to him. Patient reports he believes he struggles with an eating disorder and does not identify the alcohol of being his biggest concern. Patient reports all of this started about 3-4 years ago after the loss of his relationship with his ex-boyfriend. Patient reports since then, there has been a plethora of things happen within his life that has contributed to the hopelessness he feels. Family conflicts, loss of jobs, loss of car, loss of friends, and reports inability to regulate his emotions. Patient reports when he eats, he either becomes very emotional or "super pissed off", and reports he is just trying to make sense of it all. Patient reports lately he has been staying at a house [place] the [place] owned by his father.  Patient reports his parents live in [place], however, reports a great deal of support from family. Patient reports that his current goal is to seek residential placement for himself with hopes that he could decrease his drinking and figure out a better plan for himself. Patient also reports an interest in IOP once he has completed a 30-day residential program. No other needs were reported at this time. Patient did provide permission for LCSW to follow up with his mother Dhruvan Gullion [phone] or Father Ed Foell [phone]  to gain collateral. Patient aware that once update has been provided, LCSW will follow up to inform.   Referral will be sent to [facility] for review. Agency is currently not accepting admissions until next Monday 11/27.   Lucius Conn, LCSW Clinical Social Worker [facility] Ph: [phone]

## 2022-03-09 NOTE — ED Notes (Signed)
Pt requested for Vistaril for anxiety.

## 2022-03-10 DIAGNOSIS — F902 Attention-deficit hyperactivity disorder, combined type: Secondary | ICD-10-CM | POA: Diagnosis not present

## 2022-03-10 DIAGNOSIS — F322 Major depressive disorder, single episode, severe without psychotic features: Secondary | ICD-10-CM | POA: Diagnosis not present

## 2022-03-10 DIAGNOSIS — Z1152 Encounter for screening for COVID-19: Secondary | ICD-10-CM | POA: Diagnosis not present

## 2022-03-10 DIAGNOSIS — F1020 Alcohol dependence, uncomplicated: Secondary | ICD-10-CM | POA: Diagnosis not present

## 2022-03-10 MED ORDER — MELATONIN 3 MG PO TABS
3.0000 mg | ORAL_TABLET | Freq: Once | ORAL | Status: AC | PRN
  Administered 2022-03-10: 3 mg via ORAL
  Filled 2022-03-10: qty 1

## 2022-03-10 MED ORDER — NICOTINE POLACRILEX 2 MG MT GUM: 4.0000 mg | CHEWING_GUM | OROMUCOSAL | Status: DC | PRN

## 2022-03-10 MED ORDER — NICOTINE 21 MG/24HR TD PT24
21.0000 mg | MEDICATED_PATCH | Freq: Every morning | TRANSDERMAL | Status: DC
  Administered 2022-03-10 – 2022-03-12 (×3): 21 mg via TRANSDERMAL
  Filled 2022-03-10: qty 1
  Filled 2022-03-10: qty 14
  Filled 2022-03-10 (×2): qty 1

## 2022-03-10 NOTE — ED Notes (Signed)
Pt is in the bed sleeping. Respirations are even and unlabored. No acute distress noted. Will continue to monitor for safety. 

## 2022-03-10 NOTE — ED Notes (Addendum)
Patient a&o x3. Routine conducted according to faculty protocol .Encourge patient to notify  staff with any needs or concerns. Patient verbalized agreement Will continue to monitor for safety. 

## 2022-03-10 NOTE — Discharge Planning (Signed)
Referral was received and per Uvaldo Rising, patient has been accepted and can transfer to the facility on Monday 11/27 by 9:00am. Update to be provided to the patient and MD. Patient will need a 14-30 day supply of medication and one month refill. No nicotine gum allowed, however 14-30 day nicotine patches to be provided if needed. No other needs to report at this time.    LCSW will continue to follow up and provide updates as received.    Fernande Boyden, LCSW Clinical Social Worker [facility] Ph: [phone]

## 2022-03-10 NOTE — ED Notes (Signed)
Patient  sleeping in no acute stress. RR even and unlabored .Environment secured .Will continue to monitor for safely. 

## 2022-03-10 NOTE — ED Notes (Signed)
Pt sleeping@this time. Breathing even and unlabored. Will continue to monitor for safety 

## 2022-03-10 NOTE — Progress Notes (Signed)
Patient is attending group run by social worker. 

## 2022-03-10 NOTE — ED Notes (Signed)
Patient a&o x3. Denies SI/HI/AVH,Denies withdrawal sx now..Denies intent or plan  to harm self or others. Routine conducted according to faculty protocol .Encourge patient to notify  staff with any needs or concerns. Patient verbalized agreement .Will continue to monitor for safety. 

## 2022-03-10 NOTE — ED Notes (Signed)
Pt sitting in dining room appropriately interacting with peers. A&O x4, calm and cooperative. Denies current SI/HI/AVH. No signs of distress noted. MHT provided snack. Monitoring for safety.
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
## 2022-03-11 ENCOUNTER — Encounter (HOSPITAL_COMMUNITY): Payer: Self-pay | Admitting: Psychiatry

## 2022-03-11 DIAGNOSIS — F902 Attention-deficit hyperactivity disorder, combined type: Secondary | ICD-10-CM | POA: Diagnosis not present

## 2022-03-11 DIAGNOSIS — F322 Major depressive disorder, single episode, severe without psychotic features: Secondary | ICD-10-CM | POA: Diagnosis not present

## 2022-03-11 DIAGNOSIS — F1020 Alcohol dependence, uncomplicated: Secondary | ICD-10-CM | POA: Diagnosis not present

## 2022-03-11 DIAGNOSIS — Z1152 Encounter for screening for COVID-19: Secondary | ICD-10-CM | POA: Diagnosis not present

## 2022-03-11 MED ORDER — MELATONIN 3 MG PO TABS
3.0000 mg | ORAL_TABLET | Freq: Every day | ORAL | Status: DC
  Administered 2022-03-11: 3 mg via ORAL
  Filled 2022-03-11: qty 1

## 2022-03-11 MED ORDER — HYDROXYZINE HCL 25 MG PO TABS
25.0000 mg | ORAL_TABLET | Freq: Three times a day (TID) | ORAL | Status: DC
  Administered 2022-03-11 – 2022-03-15 (×12): 25 mg via ORAL
  Filled 2022-03-11 (×7): qty 1
  Filled 2022-03-11: qty 20
  Filled 2022-03-11 (×5): qty 1

## 2022-03-11 NOTE — ED Notes (Signed)
Patient resting quietly in bed with eyes closed. Respirations equal and unlabored, skin warm and dry, NAD. No change in assessment or acuity. Q 15 minute safety checks remain in place.   

## 2022-03-11 NOTE — ED Notes (Signed)
Pt is in the bed sleeping. Respirations are even and unlabored. No acute distress noted. Will continue to monitor for safety. 

## 2022-03-11 NOTE — ED Notes (Signed)
Pt is in the dayroom watching TV.  Respirations are even and unlabored. No acute distress noted. Will continue to monitor for safety. 

## 2022-03-11 NOTE — ED Notes (Signed)
Patient in dayroom watching TV with his peers appropriately. Respirations equal and unlabored, skin warm and dry, NAD. No change in assessment or acuity. Q 15 minute safety checks remain in place.

## 2022-03-11 NOTE — ED Notes (Signed)
Pt sleeping@this time. Breathing even and unlabored. Will continue to monitor for safety 

## 2022-03-11 NOTE — ED Notes (Signed)
Patient A&Ox4. Patient denies SI/HI and AVH.Patient denies any physical complaints when asked. No acute distress noted. Support and encouragement provided. Routine safety checks conducted according to facility protocol. Encouraged patient to notify staff if thoughts of harm toward self or others arise. Patient verbalize understanding and agreement. Q 15 min safety checks remain in place.Will continue to monitor for safety.    
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
## 2022-03-12 DIAGNOSIS — F902 Attention-deficit hyperactivity disorder, combined type: Secondary | ICD-10-CM | POA: Diagnosis not present

## 2022-03-12 DIAGNOSIS — F322 Major depressive disorder, single episode, severe without psychotic features: Secondary | ICD-10-CM | POA: Diagnosis not present

## 2022-03-12 DIAGNOSIS — Z1152 Encounter for screening for COVID-19: Secondary | ICD-10-CM | POA: Diagnosis not present

## 2022-03-12 DIAGNOSIS — F1020 Alcohol dependence, uncomplicated: Secondary | ICD-10-CM | POA: Diagnosis not present

## 2022-03-12 MED ORDER — MELATONIN 3 MG PO TABS
3.0000 mg | ORAL_TABLET | Freq: Every day | ORAL | Status: DC
  Administered 2022-03-12 – 2022-03-14 (×3): 3 mg via ORAL
  Filled 2022-03-12 (×2): qty 14
  Filled 2022-03-12 (×3): qty 1

## 2022-03-12 MED ORDER — NALTREXONE HCL 50 MG PO TABS: 50.0000 mg | ORAL_TABLET | Freq: Every day | ORAL | 1 refills | Status: DC

## 2022-03-12 MED ORDER — VITAMIN B-1 100 MG PO TABS: 100.0000 mg | ORAL_TABLET | Freq: Every day | ORAL | 1 refills | Status: DC

## 2022-03-12 MED ORDER — NICOTINE POLACRILEX 4 MG MT GUM: 4.0000 mg | CHEWING_GUM | OROMUCOSAL | 0 refills | Status: DC | PRN

## 2022-03-12 MED ORDER — NALTREXONE HCL 50 MG PO TABS
50.0000 mg | ORAL_TABLET | Freq: Every day | ORAL | Status: DC
  Administered 2022-03-12 – 2022-03-14 (×3): 50 mg via ORAL
  Filled 2022-03-12: qty 1
  Filled 2022-03-12: qty 14
  Filled 2022-03-12 (×2): qty 1

## 2022-03-12 MED ORDER — SIMETHICONE 80 MG PO CHEW
80.0000 mg | CHEWABLE_TABLET | Freq: Four times a day (QID) | ORAL | Status: DC | PRN
  Administered 2022-03-12 – 2022-03-15 (×4): 80 mg via ORAL
  Filled 2022-03-12 (×4): qty 1

## 2022-03-12 MED ORDER — HYDROXYZINE HCL 25 MG PO TABS: 25.0000 mg | ORAL_TABLET | Freq: Three times a day (TID) | ORAL | 0 refills | Status: DC

## 2022-03-12 MED ORDER — NICOTINE 21 MG/24HR TD PT24: 21.0000 mg | MEDICATED_PATCH | Freq: Every morning | TRANSDERMAL | 1 refills | Status: DC

## 2022-03-12 MED ORDER — SERTRALINE HCL 50 MG PO TABS: 50.0000 mg | ORAL_TABLET | Freq: Every morning | ORAL | 1 refills | Status: DC

## 2022-03-12 MED ORDER — ADULT MULTIVITAMIN W/MINERALS CH: 1.0000 | ORAL_TABLET | Freq: Every day | ORAL | 1 refills | Status: DC

## 2022-03-12 MED ORDER — MELATONIN 3 MG PO TABS: 3.0000 mg | ORAL_TABLET | Freq: Every day | ORAL | 1 refills | Status: DC

## 2022-03-12 NOTE — ED Notes (Addendum)
Patient observed/assessed in Dining room. Patient alert and oriented x 4. Affect is Bright. Patient complaints of anxiety. He denies A/V/H. He denies having any thoughts/plan of self harm and harm towards others. Fluid and snack offered. Patient states that appetite has been good throughout the day. Last BM was 03/12/22 Verbalizes no further complaints at this time. Will continue to monitor and support.
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
## 2022-03-12 NOTE — ED Notes (Signed)
Patient is awake and alert on uit.  He is calm, pleasant, organized and logical.  He is social with peers and makes needs known to staff.  No distress or somatic complaints.  Will monitor and provide a safe environment.
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
## 2022-03-12 NOTE — ED Notes (Signed)
Patient observed/assessed in room in bed appearing in no immediate distress resting peacefully. Q15 minute checks continued by MHT and nursing staff. Will continue to monitor and support. 

## 2022-03-12 NOTE — ED Notes (Signed)
Pt is in the bed sleeping. Respirations are even and unlabored. No acute distress noted. Will continue to monitor for safety. 
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
## 2022-03-13 DIAGNOSIS — F1020 Alcohol dependence, uncomplicated: Secondary | ICD-10-CM | POA: Diagnosis not present

## 2022-03-13 DIAGNOSIS — F322 Major depressive disorder, single episode, severe without psychotic features: Secondary | ICD-10-CM | POA: Diagnosis not present

## 2022-03-13 DIAGNOSIS — Z1152 Encounter for screening for COVID-19: Secondary | ICD-10-CM | POA: Diagnosis not present

## 2022-03-13 DIAGNOSIS — F902 Attention-deficit hyperactivity disorder, combined type: Secondary | ICD-10-CM | POA: Diagnosis not present

## 2022-03-13 MED ORDER — NICOTINE 7 MG/24HR TD PT24
7.0000 mg | MEDICATED_PATCH | Freq: Every morning | TRANSDERMAL | Status: DC
  Filled 2022-03-13: qty 14

## 2022-03-13 MED ORDER — NICOTINE 7 MG/24HR TD PT24: 7.0000 mg | MEDICATED_PATCH | Freq: Every morning | TRANSDERMAL | 0 refills | Status: DC

## 2022-03-13 MED ORDER — NICOTINE 7 MG/24HR TD PT24
7.0000 mg | MEDICATED_PATCH | Freq: Once | TRANSDERMAL | Status: AC
  Administered 2022-03-13: 7 mg via TRANSDERMAL
  Filled 2022-03-13: qty 1

## 2022-03-13 NOTE — ED Notes (Signed)
Patient A&Ox4. Patient is calm, cooperative and pleasant.  Patient denies SI/HI and AVH. Patient denies any physical complaints when asked. No acute distress noted. Support and encouragement provided. Routine safety checks conducted according to facility protocol. Encouraged patient to notify staff if thoughts of harm toward self or others arise. Patient verbalize understanding and agreement. Will continue to monitor for safety.

## 2022-03-13 NOTE — ED Notes (Signed)
Lunch was given 

## 2022-03-13 NOTE — ED Notes (Signed)
Pt is sleeping. No distress noted. Will continue to monitor safety. 

## 2022-03-13 NOTE — ED Notes (Signed)
Breakfast was given 

## 2022-03-13 NOTE — ED Notes (Signed)
Patient observed/assessed in bed/chair resting quietly appearing with no distress and verbalizing no complaints at this time. Will continue to monitor.  

## 2022-03-13 NOTE — ED Notes (Signed)
Patient sitting in dayroom eating dinner. Patient voices no complaints or concerns at this time. Respirations equal and unlabored, skin warm and dry, NAD. No change in assessment or acuity. Q 15 minute safety checks remain in place.

## 2022-03-13 NOTE — ED Notes (Signed)
Dinner was given 

## 2022-03-13 NOTE — ED Notes (Signed)
Patient resting quietly in bed.Respirations equal and unlabored, skin warm and dry, NAD. No change in assessment or acuity. Q 15 minute safety checks remain in place.   

## 2022-03-13 NOTE — ED Provider Notes (Signed)
Behavioral Health Progress Note  Date and Time: 03/13/2022 11:48 AM Name: Patrick Alexander MRN:  [id number]  Subjective:   Patrick Cousinsvan Kuras is a 36 y.o. male with AUD, MDD, PTSD, restrictive eating, ADHD, tobacco use d/o, cannabis use d/o, remote NSSIB, remote inpatient psych admission, and no suicide attempts, who [facility]) with parents for SI in the setting of AUD, then [facility] ([facility]) for EtOH detox and assistance with residential rehab placement.    He reports that he is tolerating detox well without any withdrawal symptoms today.  He reports he tolerated starting the naltrexone yesterday without any issues.  He reports no cravings.  He reports that the Gas-X has been helpful in reducing the pain from excessive gas.  He reports requests a reduction in his nicotine patch as he is planning to stop smoking entirely and so wants to taper off the nicotine.  He reports a 7 mg patch and discussed with him this could be ordered.  He reports he is looking forward to going to residential rehab on Monday.  He reports no SI, HI, or AVH.  He reports sleep is good.  He reports appetite is doing good.  He reports no other concerns at present.  Diagnosis:  Final diagnoses:  Alcohol use disorder, severe, dependence (HCC)  Tobacco use disorder  Attention deficit hyperactivity disorder (ADHD), combined type  MDD (major depressive disorder), severe (HCC)  PTSD (post-traumatic stress disorder)  Eating disorder, unspecified type  Cannabis abuse, daily use    Total Time spent with patient: 20 minutes  Past Psychiatric History: AUD, MDD, PTSD, restrictive eating, ADHD, tobacco use d/o, cannabis use d/o, remote NSSIB, remote inpatient psych admission, and no suicide attempts Past Medical History:  Past Medical History:  Diagnosis Date   ADD (attention deficit disorder)    Hemorrhoids 02/03/2015   High risk sexual behavior 02/03/2015   History of admission to  inpatient psychiatry department 03/09/2022   History of non-suicidal self-harm 03/09/2022   Sleep disorder 06/02/2015   Tobacco use disorder 06/02/2015    Past Surgical History:  Procedure Laterality Date   TONSILLECTOMY     TYMPANOSTOMY TUBE PLACEMENT     WISDOM TOOTH EXTRACTION     Family History:  Family History  Problem Relation Age of Onset   Allergies Mother    Hyperlipidemia Mother    Diabetes Mother    Cancer Mother        endometrial   Allergies Father    Hypertension Father    Stroke Father    Depression Father    Hyperlipidemia Father    Allergies Sister    Hyperlipidemia Sister    Family Psychiatric  History: No Known Diagnosis', Substance Abuse, or Suicides. Social History:  Social History   Substance and Sexual Activity  Alcohol Use Yes   Alcohol/week: 0.0 standard drinks of alcohol   Comment: social     Social History   Substance and Sexual Activity  Drug Use No    Social History   Socioeconomic History   Marital status: Single    Spouse name: Not on file   Number of children: Not on file   Years of education: Not on file   Highest education level: Not on file  Occupational History   Not on file  Tobacco Use   Smoking status: Every Day    Packs/day: 0.50    Types: Cigarettes    Last attempt to quit: 01/30/2015    Years since quitting:  7.1   Smokeless tobacco: Not on file  Substance and Sexual Activity   Alcohol use: Yes    Alcohol/week: 0.0 standard drinks of alcohol    Comment: social   Drug use: No   Sexual activity: Yes  Other Topics Concern   Not on file  Social History Narrative   Not on file   Social Determinants of Health   Financial Resource Strain: Not on file  Food Insecurity: Not on file  Transportation Needs: Not on file  Physical Activity: Not on file  Stress: Not on file  Social Connections: Not on file   SDOH:  SDOH Screenings   Depression (PHQ2-9): High Risk (03/11/2022)  Tobacco Use: High Risk  (03/11/2022)   Additional Social History:    Pain Medications: see MAR Prescriptions: see MAR Over the Counter: see MAR History of alcohol / drug use?: Yes Longest period of sobriety (when/how long): uta Negative Consequences of Use: Financial, Personal relationships Withdrawal Symptoms: Anorexia, Other (Comment), Cramps (body aches) Name of Substance 1: alcohol 1 - Age of First Use: 23 1 - Amount (size/oz): 12-18 beers 1 - Frequency: daily 1 - Last Use / Amount: today 1- Route of Use: oral                  Sleep: Good  Appetite:  Good  Current Medications:  Current Facility-Administered Medications  Medication Dose Route Frequency Provider Last Rate Last Admin   acetaminophen (TYLENOL) tablet 650 mg  650 mg Oral Q6H PRN Sindy Guadeloupe, NP       alum & mag hydroxide-simeth (MAALOX/MYLANTA) 200-200-20 MG/5ML suspension 30 mL  30 mL Oral Q4H PRN Sindy Guadeloupe, NP   30 mL at 03/10/22 1651   feeding supplement (ENSURE ENLIVE / ENSURE PLUS) liquid 237 mL  237 mL Oral PRN Princess Bruins, DO       hydrOXYzine (ATARAX) tablet 25 mg  25 mg Oral TID Princess Bruins, DO   25 mg at 03/13/22 0920   magnesium hydroxide (MILK OF MAGNESIA) suspension 30 mL  30 mL Oral Daily PRN Sindy Guadeloupe, NP   30 mL at 03/11/22 2011   melatonin tablet 3 mg  3 mg Oral QHS Princess Bruins, DO   3 mg at 03/12/22 2110   multivitamin with minerals tablet 1 tablet  1 tablet Oral Daily Sindy Guadeloupe, NP   1 tablet at 03/13/22 0920   naltrexone (DEPADE) tablet 50 mg  50 mg Oral QHS Princess Bruins, DO   50 mg at 03/12/22 2111   [START ON 03/14/2022] nicotine (NICODERM CQ - dosed in mg/24 hr) patch 7 mg  7 mg Transdermal q AM Lauro Franklin, MD       nicotine polacrilex (NICORETTE) gum 4 mg  4 mg Oral PRN Princess Bruins, DO       sertraline (ZOLOFT) tablet 50 mg  50 mg Oral q AM Princess Bruins, DO   50 mg at 03/13/22 0709   simethicone (MYLICON) chewable tablet 80 mg  80 mg Oral QID PRN Princess Bruins, DO   80 mg  at 03/13/22 1610   thiamine (VITAMIN B1) tablet 100 mg  100 mg Oral Daily Sindy Guadeloupe, NP   100 mg at 03/13/22 9604   Current Outpatient Medications  Medication Sig Dispense Refill   hydrOXYzine (ATARAX) 25 MG tablet Take 1 tablet (25 mg total) by mouth 3 (three) times daily. 30 tablet 0   melatonin 3 MG TABS tablet Take 1 tablet (3 mg total) by mouth  at bedtime. 30 tablet 1   Multiple Vitamin (MULTIVITAMIN WITH MINERALS) TABS tablet Take 1 tablet by mouth daily. 30 tablet 1   naltrexone (DEPADE) 50 MG tablet Take 1 tablet (50 mg total) by mouth at bedtime. 30 tablet 1   [START ON 03/14/2022] nicotine (NICODERM CQ - DOSED IN MG/24 HR) 7 mg/24hr patch Place 1 patch (7 mg total) onto the skin in the morning. 28 patch 0   sertraline (ZOLOFT) 50 MG tablet Take 1 tablet (50 mg total) by mouth in the morning. 30 tablet 1   thiamine (VITAMIN B-1) 100 MG tablet Take 1 tablet (100 mg total) by mouth daily. 30 tablet 1    Labs  Lab Results:  Admission on 03/08/2022  Component Date Value Ref Range Status   SARS Coronavirus 2 by RT PCR 03/09/2022 NEGATIVE  NEGATIVE Final   Comment: (NOTE) SARS-CoV-2 target nucleic acids are NOT DETECTED.  The SARS-CoV-2 RNA is generally detectable in upper respiratory specimens during the acute phase of infection. The lowest concentration of SARS-CoV-2 viral copies this assay can detect is 138 copies/mL. A negative result does not preclude SARS-Cov-2 infection and should not be used as the sole basis for treatment or other patient management decisions. A negative result may occur with  improper specimen collection/handling, submission of specimen other than nasopharyngeal swab, presence of viral mutation(s) within the areas targeted by this assay, and inadequate number of viral copies(<138 copies/mL). A negative result must be combined with clinical observations, patient history, and epidemiological information. The expected result is Negative.  Fact Sheet  for Patients:  BloggerCourse.com  Fact Sheet for Healthcare Providers:  SeriousBroker.it  This test is no                          t yet approved or cleared by the [place] FDA and  has been authorized for detection and/or diagnosis of SARS-CoV-2 by FDA under an Emergency Use Authorization (EUA). This EUA will remain  in effect (meaning this test can be used) for the duration of the COVID-19 declaration under Section 564(b)(1) of the Act, 21 U.S.C.section 360bbb-3(b)(1), unless the authorization is terminated  or revoked sooner.       Influenza A by PCR 03/09/2022 NEGATIVE  NEGATIVE Final   Influenza B by PCR 03/09/2022 NEGATIVE  NEGATIVE Final   Comment: (NOTE) The Xpert Xpress SARS-CoV-2/FLU/RSV plus assay is intended as an aid in the diagnosis of influenza from Nasopharyngeal swab specimens and should not be used as a sole basis for treatment. Nasal washings and aspirates are unacceptable for Xpert Xpress SARS-CoV-2/FLU/RSV testing.  Fact Sheet for Patients: BloggerCourse.com  Fact Sheet for Healthcare Providers: SeriousBroker.it  This test is not yet approved or cleared by the [place] FDA and has been authorized for detection and/or diagnosis of SARS-CoV-2 by FDA under an Emergency Use Authorization (EUA). This EUA will remain in effect (meaning this test can be used) for the duration of the COVID-19 declaration under Section 564(b)(1) of the Act, 21 U.S.C. section 360bbb-3(b)(1), unless the authorization is terminated or revoked.  Performed at [facility], [address], [place], [place] [address]    WBC 03/09/2022 7.8  4.0 - 10.5 K/uL Final   RBC 03/09/2022 4.09 (L)  4.22 - 5.81 MIL/uL Final   Hemoglobin 03/09/2022 13.6  13.0 - 17.0 g/dL Final   HCT 60/45/4098 38.5 (L)  39.0 - 52.0 % Final   MCV 03/09/2022 94.1  80.0 -  100.0 fL Final   MCH  03/09/2022 33.3  26.0 - 34.0 pg Final   MCHC 03/09/2022 35.3  30.0 - 36.0 g/dL Final   RDW 63/89/3734 12.0  11.5 - 15.5 % Final   Platelets 03/09/2022 447 (H)  150 - 400 K/uL Final   nRBC 03/09/2022 0.0  0.0 - 0.2 % Final   Neutrophils Relative % 03/09/2022 54  % Final   Neutro Abs 03/09/2022 4.2  1.7 - 7.7 K/uL Final   Lymphocytes Relative 03/09/2022 35  % Final   Lymphs Abs 03/09/2022 2.8  0.7 - 4.0 K/uL Final   Monocytes Relative 03/09/2022 9  % Final   Monocytes Absolute 03/09/2022 0.7  0.1 - 1.0 K/uL Final   Eosinophils Relative 03/09/2022 1  % Final   Eosinophils Absolute 03/09/2022 0.1  0.0 - 0.5 K/uL Final   Basophils Relative 03/09/2022 1  % Final   Basophils Absolute 03/09/2022 0.0  0.0 - 0.1 K/uL Final   Immature Granulocytes 03/09/2022 0  % Final   Abs Immature Granulocytes 03/09/2022 0.01  0.00 - 0.07 K/uL Final   Performed at [facility], [address], [place], [place] [address]   Sodium 03/09/2022 141  135 - 145 mmol/L Final   Potassium 03/09/2022 3.9  3.5 - 5.1 mmol/L Final   Chloride 03/09/2022 102  98 - 111 mmol/L Final   CO2 03/09/2022 26  22 - 32 mmol/L Final   Glucose, Bld 03/09/2022 91  70 - 99 mg/dL Final   Glucose reference range applies only to samples taken after fasting for at least 8 hours.   BUN 03/09/2022 15  6 - 20 mg/dL Final   Creatinine, Ser 03/09/2022 0.97  0.61 - 1.24 mg/dL Final   Calcium 11/57/2620 9.4  8.9 - 10.3 mg/dL Final   Total Protein 35/59/7416 6.3 (L)  6.5 - 8.1 g/dL Final   Albumin 38/45/3646 4.3  3.5 - 5.0 g/dL Final   AST 80/32/1224 32  15 - 41 U/L Final   ALT 03/09/2022 31  0 - 44 U/L Final   Alkaline Phosphatase 03/09/2022 48  38 - 126 U/L Final   Total Bilirubin 03/09/2022 0.6  0.3 - 1.2 mg/dL Final   GFR, Estimated 03/09/2022 >60  >60 mL/min Final   Comment: (NOTE) Calculated using the CKD-EPI Creatinine Equation (2021)    Anion gap 03/09/2022 13  5 - 15 Final   Performed at [facility], [address],  [place], [place] [address]   Hgb A1c MFr Bld 03/09/2022 5.3  4.8 - 5.6 % Final   Comment: (NOTE) Pre diabetes:          5.7%-6.4%  Diabetes:              >6.4%  Glycemic control for   <7.0% adults with diabetes    Mean Plasma Glucose 03/09/2022 105.41  mg/dL Final   Performed at [facility], [address], [place], [place] [address]   Alcohol, Ethyl (B) 03/09/2022 <10  <10 mg/dL Final   Comment: (NOTE) Lowest detectable limit for serum alcohol is 10 mg/dL.  For medical purposes only. Performed at [facility], [address], [place], [place] [address]    Cholesterol 03/09/2022 192  0 - 200 mg/dL Final   Triglycerides 94/50/3888 104  <150 mg/dL Final   HDL 28/00/3491 73  >40 mg/dL Final   Total CHOL/HDL Ratio 03/09/2022 2.6  RATIO Final   VLDL 03/09/2022 21  0 -  40 mg/dL Final   LDL Cholesterol 03/09/2022 98  0 - 99 mg/dL Final   Comment:        Total Cholesterol/HDL:CHD Risk Coronary Heart Disease Risk Table                     Men   Women  1/2 Average Risk   3.4   3.3  Average Risk       5.0   4.4  2 X Average Risk   9.6   7.1  3 X Average Risk  23.4   11.0        Use the calculated Patient Ratio above and the CHD Risk Table to determine the patient's CHD Risk.        ATP III CLASSIFICATION (LDL):  <100     mg/dL   Optimal  010-932  mg/dL   Near or Above                    Optimal  130-159  mg/dL   Borderline  355-732  mg/dL   High  >202     mg/dL   Very High Performed at [facility], [address] [place], [place] [address]    TSH 03/09/2022 2.539  0.350 - 4.500 uIU/mL Final   Comment: Performed by a 3rd Generation assay with a functional sensitivity of <=0.01 uIU/mL. Performed at [facility], [address], [place], [place] [address]    POC Amphetamine UR 03/09/2022 None Detected  NONE DETECTED (Cut Off Level 1000 ng/mL) Final   POC Secobarbital (BAR) 03/09/2022 None Detected  NONE DETECTED (Cut Off Level 300 ng/mL) Final   POC  Buprenorphine (BUP) 03/09/2022 None Detected  NONE DETECTED (Cut Off Level 10 ng/mL) Final   POC Oxazepam (BZO) 03/09/2022 None Detected  NONE DETECTED (Cut Off Level 300 ng/mL) Final   POC Cocaine UR 03/09/2022 None Detected  NONE DETECTED (Cut Off Level 300 ng/mL) Final   POC Methamphetamine UR 03/09/2022 None Detected  NONE DETECTED (Cut Off Level 1000 ng/mL) Final   POC Morphine 03/09/2022 None Detected  NONE DETECTED (Cut Off Level 300 ng/mL) Final   POC Methadone UR 03/09/2022 None Detected  NONE DETECTED (Cut Off Level 300 ng/mL) Final   POC Oxycodone UR 03/09/2022 None Detected  NONE DETECTED (Cut Off Level 100 ng/mL) Final   POC Marijuana UR 03/09/2022 Positive (A)  NONE DETECTED (Cut Off Level 50 ng/mL) Final   SARSCOV2ONAVIRUS 2 AG 03/09/2022 NEGATIVE  NEGATIVE Final   Comment: (NOTE) SARS-CoV-2 antigen NOT DETECTED.   Negative results are presumptive.  Negative results do not preclude SARS-CoV-2 infection and should not be used as the sole basis for treatment or other patient management decisions, including infection  control decisions, particularly in the presence of clinical signs and  symptoms consistent with COVID-19, or in those who have been in contact with the virus.  Negative results must be combined with clinical observations, patient history, and epidemiological information. The expected result is Negative.  Fact Sheet for Patients: https://www.jennings-kim.com/  Fact Sheet for Healthcare Providers: https://Ladarious Kresse-rogers.biz/  This test is not yet approved or cleared by the [place] FDA and  has been authorized for detection and/or diagnosis of SARS-CoV-2 by FDA under an Emergency Use Authorization (EUA).  This EUA will remain in effect (meaning this test can be used) for the duration of  the COV  ID-19 declaration under Section 564(b)(1) of the Act, 21 U.S.C. section 360bbb-3(b)(1), unless the authorization  is terminated or revoked sooner.      Blood Alcohol level:  Lab Results  Component Value Date   ETH <10 03/09/2022    Metabolic Disorder Labs: Lab Results  Component Value Date   HGBA1C 5.3 03/09/2022   MPG 105.41 03/09/2022   No results found for: "PROLACTIN" Lab Results  Component Value Date   CHOL 192 03/09/2022   TRIG 104 03/09/2022   HDL 73 03/09/2022   CHOLHDL 2.6 03/09/2022   VLDL 21 03/09/2022   LDLCALC 98 03/09/2022   LDLCALC 120 04/23/2015    Therapeutic Lab Levels: No results found for: "LITHIUM" No results found for: "VALPROATE" No results found for: "CBMZ"  Physical Findings   PHQ2-9    Flowsheet Row ED from 03/08/2022 in [facility] Office Visit from 04/23/2015 in [facility]  PHQ-2 Total Score 6 0  PHQ-9 Total Score 26 --      Flowsheet Row ED from 03/08/2022 in [facility]  C-SSRS RISK CATEGORY Error: Q3, 4, or 5 should not be populated when Q2 is No        Musculoskeletal  Strength & Muscle Tone: within normal limits Gait & Station: normal Patient leans: N/A  Psychiatric Specialty Exam  Presentation  General Appearance:  Appropriate for Environment; Casual  Eye Contact: Good  Speech: Clear and Coherent; Normal Rate  Speech Volume: Normal  Handedness: Right   Mood and Affect  Mood: Anxious  Affect: Appropriate; Congruent   Thought Process  Thought Processes: Coherent; Goal Directed  Descriptions of Associations:Intact  Orientation:Full (Time, Place and Person)  Thought Content:Logical (Ruminates over his medications)  Diagnosis of Schizophrenia or Schizoaffective disorder in past: No    Hallucinations:Hallucinations: None  Ideas of Reference:None  Suicidal Thoughts:Suicidal Thoughts: No  Homicidal Thoughts:Homicidal Thoughts: No   Sensorium  Memory: Immediate Fair; Recent Fair  Judgment: Fair  Insight: Present   Executive  Functions  Concentration: Good  Attention Span: Good  Recall: Good  Fund of Knowledge: Good  Language: Good   Psychomotor Activity  Psychomotor Activity: Psychomotor Activity: Normal   Assets  Assets: Communication Skills; Desire for Improvement; Housing; Leisure Time; Social Support; Transportation   Sleep  Sleep: Sleep: Good   No data recorded  Physical Exam  Physical Exam Vitals and nursing note reviewed.  Constitutional:      General: He is not in acute distress.    Appearance: Normal appearance. He is normal weight. He is not ill-appearing or toxic-appearing.  HENT:     Head: Normocephalic and atraumatic.  Pulmonary:     Effort: Pulmonary effort is normal.  Musculoskeletal:        General: Normal range of motion.  Neurological:     General: No focal deficit present.     Mental Status: He is alert.    Review of Systems  Respiratory:  Negative for cough and shortness of breath.   Cardiovascular:  Negative for chest pain.  Gastrointestinal:  Negative for abdominal pain, constipation, diarrhea, nausea and vomiting.  Neurological:  Negative for dizziness, weakness and headaches.  Psychiatric/Behavioral:  Negative for depression, hallucinations and suicidal ideas. The patient is nervous/anxious.    Blood pressure 126/89, pulse 77, temperature 98.8 F (37.1 C), temperature source Oral, resp. rate 18, SpO2 100 %. There is no height or weight on file to calculate BMI.  Treatment Plan Summary: Daily contact with patient  to assess and evaluate symptoms and progress in treatment and Medication management  Roxy Filler is a 36 y.o. male with AUD, MDD, PTSD, restrictive eating, ADHD, tobacco use d/o, cannabis use d/o, remote NSSIB, remote inpatient psych admission, and no suicide attempts, who [facility] with parents for SI in the setting of AUD, then admitted to [facility] (03/08/2022) for EtOH detox and assistance with residential  rehab placement.    Harshal is tolerating detox well with no withdrawal symptoms.  He is scheduled to end his Librium taper today.  He tolerated starting the naltrexone yesterday without issue.  He asked for his nicotine patch to be decreased as he is wanting to stop smoking and using nicotine eventually.  We will not make any other changes to his medications at this time.  He is still set for discharge to residential rehab Monday 11/27.  We will continue to monitor.   AUD, severe Action stage. Daily drink about 12x beers, for past 6 mo, with last drink was 11/19, drank a 12 pack. Denied h/o seizures and DT.  BAL negative, AST/ALT wnl. Discussed naltrexone, patient considering it. Reported no sxs of shakes or tremors. CIWA 1,0,0,0  latest= 0  @ 1300  11/24 -CIWA with librium PRN per protocol with thiamine & MV supplement -Continue Librium taper to end 11/25 -Continue Naltrexone 50 mg QHS    MDD  PTSD  Restrictive eating pattern -Continue Zoloft 50 mg daily -Continue Melatonin 3 mg QHS  -Continue Vistaril 25 mg TID    Cannabis use d/o  Tobacco use d/o Contemplative stage. Smokes 1PPD tobacco. Vapes delta 8 or ingests gummies near daily. Craving for tobacco improving some. -Decrease Nicotine Patch to 7 mg daily    ADHD -Recommend re-establishing care with psychiatry -has been told about [facility] and walk-in hours for med management and therapy    Considerations for follow-up: -Re-establishing with outpatient psychiatry and therapy -Provided info for [facility]     DISPO: -[facility] 11/27 -Interested in [facility] after rehab   Lauro Franklin, MD 03/13/2022 11:48 AM

## 2022-03-14 DIAGNOSIS — F1020 Alcohol dependence, uncomplicated: Secondary | ICD-10-CM | POA: Diagnosis not present

## 2022-03-14 DIAGNOSIS — F902 Attention-deficit hyperactivity disorder, combined type: Secondary | ICD-10-CM | POA: Diagnosis not present

## 2022-03-14 DIAGNOSIS — F322 Major depressive disorder, single episode, severe without psychotic features: Secondary | ICD-10-CM | POA: Diagnosis not present

## 2022-03-14 DIAGNOSIS — Z1152 Encounter for screening for COVID-19: Secondary | ICD-10-CM | POA: Diagnosis not present

## 2022-03-14 MED ORDER — MELATONIN 3 MG PO TABS: 3.0000 mg | ORAL_TABLET | Freq: Every day | ORAL | 1 refills | Status: DC

## 2022-03-14 MED ORDER — NALTREXONE HCL 50 MG PO TABS: 50.0000 mg | ORAL_TABLET | Freq: Every day | ORAL | 1 refills | Status: DC

## 2022-03-14 MED ORDER — NALTREXONE HCL 50 MG PO TABS: 50.0000 mg | ORAL_TABLET | Freq: Every day | ORAL | 0 refills | Status: DC

## 2022-03-14 MED ORDER — NICOTINE 7 MG/24HR TD PT24: 7.0000 mg | MEDICATED_PATCH | Freq: Every morning | TRANSDERMAL | 0 refills | Status: DC

## 2022-03-14 MED ORDER — SERTRALINE HCL 50 MG PO TABS: 50.0000 mg | ORAL_TABLET | Freq: Every morning | ORAL | 0 refills | Status: DC

## 2022-03-14 MED ORDER — SERTRALINE HCL 50 MG PO TABS: 50.0000 mg | ORAL_TABLET | Freq: Every morning | ORAL | 1 refills | Status: DC

## 2022-03-14 MED ORDER — MELATONIN 3 MG PO TABS: 3.0000 mg | ORAL_TABLET | Freq: Every day | ORAL | 0 refills | Status: DC

## 2022-03-14 MED ORDER — HYDROXYZINE HCL 25 MG PO TABS: 25.0000 mg | ORAL_TABLET | Freq: Three times a day (TID) | ORAL | 0 refills | Status: DC

## 2022-03-14 NOTE — ED Notes (Signed)
Pt is sleeping. No distress noted. Will continue to monitor safety. 

## 2022-03-14 NOTE — ED Notes (Signed)
Patient sitting in dayroom eating lunch. Respirations equal and unlabored, skin warm and dry, NAD. No change in assessment or acuity. Q 15 minute safety checks remain in place.   

## 2022-03-14 NOTE — ED Provider Notes (Signed)
[facility]/OBS ASAP Discharge Summary  Date and Time: 03/14/2022 3:02 PM  Name: Patrick Alexander  MRN:  [id number]   Discharge Diagnoses:  Final diagnoses:  Alcohol use disorder, severe, dependence (HCC)  Tobacco use disorder  Attention deficit hyperactivity disorder (ADHD), combined type  MDD (major depressive disorder), severe (HCC)  PTSD (post-traumatic stress disorder)  Eating disorder, unspecified type  Cannabis abuse, daily use    Subjective:  Patrick Alexander is a 36 y.o. male with AUD, MDD, PTSD, restrictive eating, ADHD, tobacco use d/o, cannabis use d/o, remote NSSIB, remote inpatient psych admission, and no suicide attempts, who presented to voluntary to [facility] (03/08/2022) with parents for SI in the setting of AUD, then admitted to [facility] (03/08/2022) for EtOH detox and assistance with residential rehab placement.    He reports that he is doing well today.  He reports no symptoms of withdrawal.  He reports no cravings.  He reports that he has done well with the Zoloft.  He reports that yesterday something happened that he would have had an issue with and blown out of proportion but states that now he just decided to let it go and forget about it.  He reports that his gas has improved but is still more than normal.  Discussed with him that if he does not continue to improve he should discuss this with his PCP.  He is looking forward to going to rehab and that he feels better than he has in a long time.  He reports no SI, HI, AVH.  He reports his sleep is good.  Reports appetite is doing good.  He reports no other concerns at present.  Stay Summary:  He presented to [facility] on 11/20 with SI in the setting of AUD.  He was admitted to [facility] on 11/20 for Detox.  He was placed on a Librium taper and tolerated detox well.  He was started on Zoloft for his depression and tolerated it well.  He was accepted to [facility] for Rehab and was discharged on 11/27.  Total Time spent with patient: 20  minutes  Past Psychiatric History: AUD, MDD, PTSD, restrictive eating, ADHD, tobacco use d/o, cannabis use d/o, remote NSSIB, remote inpatient psych admission, and no suicide attempts  Past Medical History:  Past Medical History:  Diagnosis Date   ADD (attention deficit disorder)    Hemorrhoids 02/03/2015   High risk sexual behavior 02/03/2015   History of admission to inpatient psychiatry department 03/09/2022   History of non-suicidal self-harm 03/09/2022   Sleep disorder 06/02/2015   Tobacco use disorder 06/02/2015    Past Surgical History:  Procedure Laterality Date   TONSILLECTOMY     TYMPANOSTOMY TUBE PLACEMENT     WISDOM TOOTH EXTRACTION     Family History:  Family History  Problem Relation Age of Onset   Allergies Mother    Hyperlipidemia Mother    Diabetes Mother    Cancer Mother        endometrial   Allergies Father    Hypertension Father    Stroke Father    Depression Father    Hyperlipidemia Father    Allergies Sister    Hyperlipidemia Sister    Family Psychiatric History:  No Known Diagnosis', Substance Abuse, or Suicides.  Social History:  Social History   Substance and Sexual Activity  Alcohol Use Yes   Alcohol/week: 0.0 standard drinks of alcohol   Comment: social     Social History   Substance and Sexual Activity  Drug  Use No    Social History   Socioeconomic History   Marital status: Single    Spouse name: Not on file   Number of children: Not on file   Years of education: Not on file   Highest education level: Not on file  Occupational History   Not on file  Tobacco Use   Smoking status: Every Day    Packs/day: 0.50    Types: Cigarettes    Last attempt to quit: 01/30/2015    Years since quitting: 7.1   Smokeless tobacco: Not on file  Substance and Sexual Activity   Alcohol use: Yes    Alcohol/week: 0.0 standard drinks of alcohol    Comment: social   Drug use: No   Sexual activity: Yes  Other Topics Concern   Not on file   Social History Narrative   Not on file   Social Determinants of Health   Financial Resource Strain: Not on file  Food Insecurity: Not on file  Transportation Needs: Not on file  Physical Activity: Not on file  Stress: Not on file  Social Connections: Not on file   SDOH:  SDOH Screenings   Depression (PHQ2-9): High Risk (03/11/2022)  Tobacco Use: High Risk (03/11/2022)    Tobacco Cessation:  A prescription for an FDA-approved tobacco cessation medication provided at discharge  Current Medications:  Current Facility-Administered Medications  Medication Dose Route Frequency Provider Last Rate Last Admin   acetaminophen (TYLENOL) tablet 650 mg  650 mg Oral Q6H PRN Sindy Guadeloupe, NP       alum & mag hydroxide-simeth (MAALOX/MYLANTA) 200-200-20 MG/5ML suspension 30 mL  30 mL Oral Q4H PRN Sindy Guadeloupe, NP   30 mL at 03/10/22 1651   feeding supplement (ENSURE ENLIVE / ENSURE PLUS) liquid 237 mL  237 mL Oral PRN Princess Bruins, DO       hydrOXYzine (ATARAX) tablet 25 mg  25 mg Oral TID Princess Bruins, DO   25 mg at 03/14/22 0933   magnesium hydroxide (MILK OF MAGNESIA) suspension 30 mL  30 mL Oral Daily PRN Sindy Guadeloupe, NP   30 mL at 03/11/22 2011   melatonin tablet 3 mg  3 mg Oral QHS Princess Bruins, DO   3 mg at 03/13/22 2126   multivitamin with minerals tablet 1 tablet  1 tablet Oral Daily Sindy Guadeloupe, NP   1 tablet at 03/14/22 0933   naltrexone (DEPADE) tablet 50 mg  50 mg Oral QHS Princess Bruins, DO   50 mg at 03/13/22 2126   nicotine polacrilex (NICORETTE) gum 4 mg  4 mg Oral PRN Princess Bruins, DO       sertraline (ZOLOFT) tablet 50 mg  50 mg Oral q AM Princess Bruins, DO   50 mg at 03/14/22 0704   simethicone (MYLICON) chewable tablet 80 mg  80 mg Oral QID PRN Princess Bruins, DO   80 mg at 03/13/22 9629   thiamine (VITAMIN B1) tablet 100 mg  100 mg Oral Daily Sindy Guadeloupe, NP   100 mg at 03/14/22 5284   Current Outpatient Medications  Medication Sig Dispense Refill   hydrOXYzine  (ATARAX) 25 MG tablet Take 1 tablet (25 mg total) by mouth 3 (three) times daily. 30 tablet 0   melatonin 3 MG TABS tablet Take 1 tablet (3 mg total) by mouth at bedtime. 30 tablet 1   naltrexone (DEPADE) 50 MG tablet Take 1 tablet (50 mg total) by mouth at bedtime. 30 tablet 1   nicotine (NICODERM CQ -  DOSED IN MG/24 HR) 7 mg/24hr patch Place 1 patch (7 mg total) onto the skin in the morning. 7 patch 0   sertraline (ZOLOFT) 50 MG tablet Take 1 tablet (50 mg total) by mouth in the morning. 30 tablet 1    PTA Medications: (Not in a hospital admission)      03/11/2022    1:54 PM 03/08/2022    1:53 PM 04/23/2015    8:44 AM  Depression screen PHQ 2/9  Decreased Interest 3 3 0  Down, Depressed, Hopeless 3 3 0  PHQ - 2 Score 6 6 0  Altered sleeping 3 3   Tired, decreased energy 3 3   Change in appetite 3 3   Feeling bad or failure about yourself  3 2   Trouble concentrating 2 3   Moving slowly or fidgety/restless 3 3   Suicidal thoughts 3 3   PHQ-9 Score 26 26   Difficult doing work/chores Extremely dIfficult Extremely dIfficult     [facility]  C-SSRS RISK CATEGORY Error: Q3, 4, or 5 should not be populated when Q2 is No       Musculoskeletal  Strength & Muscle Tone: within normal limits Gait & Station: normal Patient leans: N/A  Psychiatric Specialty Exam  Presentation  General Appearance:  Appropriate for Environment; Casual  Eye Contact: Good  Speech: Clear and Coherent; Normal Rate  Speech Volume: Normal  Handedness: Right   Mood and Affect  Mood: -- ("ok")  Affect: Appropriate; Congruent   Thought Process  Thought Processes: Coherent; Goal Directed  Descriptions of Associations:Intact  Orientation:Full (Time, Place and Person)  Thought Content:Logical  Diagnosis of Schizophrenia or Schizoaffective disorder in past: No    Hallucinations:Hallucinations: None  Ideas of  Reference:None  Suicidal Thoughts:Suicidal Thoughts: No  Homicidal Thoughts:Homicidal Thoughts: No   Sensorium  Memory: Immediate Fair; Recent Fair  Judgment: Good  Insight: Fair   Art therapistxecutive Functions  Concentration: Good  Attention Span: Good  Recall: Good  Fund of Knowledge: Good  Language: Good   Psychomotor Activity  Psychomotor Activity: Psychomotor Activity: Normal   Assets  Assets: Communication Skills; Desire for Improvement; Housing; Social Support; Resilience   Sleep  Sleep: Sleep: Good   No data recorded  Physical Exam  Physical Exam Vitals and nursing note reviewed.  Constitutional:      General: He is not in acute distress.    Appearance: Normal appearance. He is normal weight. He is not ill-appearing or toxic-appearing.  HENT:     Head: Normocephalic and atraumatic.  Pulmonary:     Effort: Pulmonary effort is normal.  Musculoskeletal:        General: Normal range of motion.  Neurological:     General: No focal deficit present.     Mental Status: He is alert.    Review of Systems  Respiratory:  Negative for cough and shortness of breath.   Cardiovascular:  Negative for chest pain.  Gastrointestinal:  Negative for abdominal pain, constipation, diarrhea, nausea and vomiting.  Neurological:  Negative for dizziness, weakness and headaches.  Psychiatric/Behavioral:  Negative for depression, hallucinations and suicidal ideas. The patient is not nervous/anxious.    Blood pressure 121/84, pulse 67, temperature 97.8 F (36.6 C), temperature source Oral, resp. rate 17, SpO2 100 %. There is no height or weight on file to calculate BMI.  Demographic Factors:  Male and Caucasian  Loss Factors: NA  Historical Factors: NA  Risk Reduction Factors:  Positive social support and Positive coping skills or problem solving skills  Continued Clinical Symptoms:  Alcohol/Substance Abuse/Dependencies More than one psychiatric  diagnosis Previous Psychiatric Diagnoses and Treatments  Cognitive Features That Contribute To Risk:  Thought constriction (tunnel vision)    Suicide Risk:  Minimal: No identifiable suicidal ideation.  Patients presenting with no risk factors but with morbid ruminations; may be classified as minimal risk based on the severity of the depressive symptoms  Plan Of Care/Follow-up recommendations:  Activity: as tolerated  Diet: heart healthy  Other: -Follow-up with your outpatient psychiatric provider -instructions on appointment date, time, and address (location) are provided to you in discharge paperwork.  -Take your psychiatric medications as prescribed at discharge - instructions are provided to you in the discharge paperwork  -Follow-up with outpatient primary care doctor and other specialists -for management of chronic medical disease, including: ADHD, excessive gas production  -Testing: Follow-up with outpatient provider for abnormal lab results: None  -Recommend abstinence from alcohol, tobacco, and other illicit drug use at discharge.   -If your psychiatric symptoms recur, worsen, or if you have side effects to your psychiatric medications, call your outpatient psychiatric provider, 911, [phone] or go to the nearest emergency department.  -If suicidal thoughts recur, call your outpatient psychiatric provider, 911, [phone] or go to the nearest emergency department.   Disposition: Discharge to [facility]  Lauro Franklin, MD 03/14/2022, 3:02 PM

## 2022-03-14 NOTE — ED Notes (Signed)
Patient observed/assessed in dining room. Patient alert and oriented x 4. Affect is bright. Patient denies pain and anxiety. He denies A/V/H. He denies having any thoughts/plan of self harm and harm towards others. Fluid and snack offered. Patient states that appetite has been good throughout the day. Last BM was 03/14/22. Verbalizes no further complaints at this time. Will continue to monitor and support.

## 2022-03-14 NOTE — ED Notes (Signed)
Patient A&Ox4. Patient denies SI/HI and AVH. Patient denies any physical complaints when asked. No acute distress noted. Support and encouragement provided. Routine safety checks conducted according to facility protocol. Encouraged patient to notify staff if thoughts of harm toward self or others arise. Patient verbalize understanding and agreement. Will continue to monitor for safety.    

## 2022-03-14 NOTE — ED Notes (Addendum)
Patient participated in" Suicide Safety Planning Workshop" led by RN. Patient was engaged and focus.Patient was given a copy of plan and a copy placed in patient chart. 

## 2022-03-14 NOTE — ED Notes (Signed)
Patient observed/assessed in room in bed appearing in no immediate distress resting peacefully. Q15 minute checks continued by MHT and nursing staff. Will continue to monitor and support. 

## 2022-03-14 NOTE — ED Notes (Signed)
Patient observed/assessed in Dining room. Patient alert and oriented x 4. Affect is bright. Patient denies pain and anxiety. He denies A/V/H. He denies having any thoughts/plan of self harm and harm towards others. Fluid and snack offered. Patient states that appetite has been good throughout the day. Last BM was 03/14/22. Verbalizes no further complaints at this time. Will continue to monitor and support.

## 2022-03-15 DIAGNOSIS — F1020 Alcohol dependence, uncomplicated: Secondary | ICD-10-CM | POA: Diagnosis not present

## 2022-03-15 DIAGNOSIS — F322 Major depressive disorder, single episode, severe without psychotic features: Secondary | ICD-10-CM | POA: Diagnosis not present

## 2022-03-15 DIAGNOSIS — Z1152 Encounter for screening for COVID-19: Secondary | ICD-10-CM | POA: Diagnosis not present

## 2022-03-15 DIAGNOSIS — F902 Attention-deficit hyperactivity disorder, combined type: Secondary | ICD-10-CM | POA: Diagnosis not present

## 2022-03-15 NOTE — ED Notes (Signed)
Patient observed/assessed in bed/chair resting quietly appearing in no distress and verbalizing no complaints at this time. Will continue to monitor.  

## 2022-03-15 NOTE — Discharge Planning (Signed)
LCSW spoke with patient on this morning regarding next steps and discharge to [facility] on today. Patient reported being ready for the transition and being eager to continue working on sobriety. LCSW provided brief supportive counseling to the patient and the patient was receptive to the feedback provided. Patient expressed appreciation for LCSW supportive throughout process of being at the [facility]. No other needs were reported at this time. RN to arrange transport for the patient. LCSW to sign off at this time.   Please inform if further LCSW needs arise prior to discharge.   Harshitha Fretz, LCSW Clinical Social Worker [facility] Ph: [phone]  

## 2022-03-29 ENCOUNTER — Ambulatory Visit: Payer: BLUE CROSS/BLUE SHIELD | Admitting: Physician Assistant

## 2022-03-29 ENCOUNTER — Encounter: Payer: Self-pay | Admitting: Physician Assistant

## 2022-03-29 VITALS — BP 164/92 | HR 88 | Ht 68.0 in | Wt 164.0 lb

## 2022-03-29 DIAGNOSIS — F411 Generalized anxiety disorder: Secondary | ICD-10-CM

## 2022-03-29 DIAGNOSIS — F17200 Nicotine dependence, unspecified, uncomplicated: Secondary | ICD-10-CM

## 2022-03-29 DIAGNOSIS — F322 Major depressive disorder, single episode, severe without psychotic features: Secondary | ICD-10-CM

## 2022-03-29 DIAGNOSIS — F1210 Cannabis abuse, uncomplicated: Secondary | ICD-10-CM

## 2022-03-29 DIAGNOSIS — F509 Eating disorder, unspecified: Secondary | ICD-10-CM

## 2022-03-29 DIAGNOSIS — F5104 Psychophysiologic insomnia: Secondary | ICD-10-CM

## 2022-03-29 DIAGNOSIS — F1020 Alcohol dependence, uncomplicated: Secondary | ICD-10-CM

## 2022-03-29 DIAGNOSIS — F902 Attention-deficit hyperactivity disorder, combined type: Secondary | ICD-10-CM

## 2022-03-29 DIAGNOSIS — J302 Other seasonal allergic rhinitis: Secondary | ICD-10-CM

## 2022-03-29 DIAGNOSIS — F4310 Post-traumatic stress disorder, unspecified: Secondary | ICD-10-CM

## 2022-03-29 DIAGNOSIS — R030 Elevated blood-pressure reading, without diagnosis of hypertension: Secondary | ICD-10-CM

## 2022-03-29 MED ORDER — NALTREXONE HCL 50 MG PO TABS: 50.0000 mg | ORAL_TABLET | Freq: Every day | ORAL | 1 refills | Status: DC

## 2022-03-29 MED ORDER — CETIRIZINE HCL 10 MG PO TABS: 10.0000 mg | ORAL_TABLET | Freq: Every day | ORAL | 1 refills | Status: DC

## 2022-03-29 MED ORDER — SERTRALINE HCL 50 MG PO TABS: 50.0000 mg | ORAL_TABLET | Freq: Every morning | ORAL | 1 refills | Status: DC

## 2022-03-29 MED ORDER — MELATONIN 3 MG PO TABS: 3.0000 mg | ORAL_TABLET | Freq: Every day | ORAL | 1 refills | Status: DC

## 2022-03-29 MED ORDER — HYDROXYZINE HCL 25 MG PO TABS: ORAL_TABLET | ORAL | 1 refills | Status: DC

## 2022-03-29 NOTE — Patient Instructions (Signed)
To help with your anxiety, you are going to continue taking the Zoloft on a daily basis.  You will increase hydroxyzine, you can take 1 to 2 tablets 3 times a day as needed.  To help with your red eyes, you are going to start using Zyrtec on a daily basis, I encourage you to use cold compresses as needed, avoid rubbing your eyes.  Your blood pressure is elevated today, I encourage you to check your blood pressure on a daily basis, keep a written log and have available for all office visits.  You will follow-up with the mobile unit in 2 weeks.  Roney Jaffe, PA-C Physician Assistant [facility] [email or web address]   Managing Anxiety, Adult After being diagnosed with anxiety, you may be relieved to know why you have felt or behaved a certain way. You may also feel overwhelmed about the treatment ahead and what it will mean for your life. With care and support, you can manage this condition. How to manage lifestyle changes Managing stress and anxiety  Stress is your body's reaction to life changes and events, both good and bad. Most stress will last just a few hours, but stress can be ongoing and can lead to more than just stress. Although stress can play a major role in anxiety, it is not the same as anxiety. Stress is usually caused by something external, such as a deadline, test, or competition. Stress normally passes after the triggering event has ended.  Anxiety is caused by something internal, such as imagining a terrible outcome or worrying that something will go wrong that will devastate you. Anxiety often does not go away even after the triggering event is over, and it can become long-term (chronic) worry. It is important to understand the differences between stress and anxiety and to manage your stress effectively so that it does not lead to an anxious response. Talk with your health care provider or a counselor to learn more about  reducing anxiety and stress. He or she may suggest tension reduction techniques, such as: Music therapy. Spend time creating or listening to music that you enjoy and that inspires you. Mindfulness-based meditation. Practice being aware of your normal breaths while not trying to control your breathing. It can be done while sitting or walking. Centering prayer. This involves focusing on a word, phrase, or sacred image that means something to you and brings you peace. Deep breathing. To do this, expand your stomach and inhale slowly through your nose. Hold your breath for 3-5 seconds. Then exhale slowly, letting your stomach muscles relax. Self-talk. Learn to notice and identify thought patterns that lead to anxiety reactions and change those patterns to thoughts that feel peaceful. Muscle relaxation. Taking time to tense muscles and then relax them. Choose a tension reduction technique that fits your lifestyle and personality. These techniques take time and practice. Set aside 5-15 minutes a day to do them. Therapists can offer counseling and training in these techniques. The training to help with anxiety may be covered by some insurance plans. Other things you can do to manage stress and anxiety include: Keeping a stress diary. This can help you learn what triggers your reaction and then learn ways to manage your response. Thinking about how you react to certain situations. You may not be able to control everything, but you can control your response. Making time for activities that help you relax and not feeling guilty about spending your time in this way. Doing visual imagery. This  involves imagining or creating mental pictures to help you relax. Practicing yoga. Through yoga poses, you can lower tension and promote relaxation.  Medicines Medicines can help ease symptoms. Medicines for anxiety include: Antidepressant medicines. These are usually prescribed for long-term daily control. Anti-anxiety  medicines. These may be added in severe cases, especially when panic attacks occur. Medicines will be prescribed by a health care provider. When used together, medicines, psychotherapy, and tension reduction techniques may be the most effective treatment. Relationships Relationships can play a big part in helping you recover. Try to spend more time connecting with trusted friends and family members. Consider going to couples counseling if you have a partner, taking family education classes, or going to family therapy. Therapy can help you and others better understand your condition. How to recognize changes in your anxiety Everyone responds differently to treatment for anxiety. Recovery from anxiety happens when symptoms decrease and stop interfering with your daily activities at home or work. This may mean that you will start to: Have better concentration and focus. Worry will interfere less in your daily thinking. Sleep better. Be less irritable. Have more energy. Have improved memory. It is also important to recognize when your condition is getting worse. Contact your health care provider if your symptoms interfere with home or work and you feel like your condition is not improving. Follow these instructions at home: Activity Exercise. Adults should do the following: Exercise for at least 150 minutes each week. The exercise should increase your heart rate and make you sweat (moderate-intensity exercise). Strengthening exercises at least twice a week. Get the right amount and quality of sleep. Most adults need 7-9 hours of sleep each night. Lifestyle  Eat a healthy diet that includes plenty of vegetables, fruits, whole grains, low-fat dairy products, and lean protein. Do not eat a lot of foods that are high in fats, added sugars, or salt (sodium). Make choices that simplify your life. Do not use any products that contain nicotine or tobacco. These products include cigarettes, chewing  tobacco, and vaping devices, such as e-cigarettes. If you need help quitting, ask your health care provider. Avoid caffeine, alcohol, and certain over-the-counter cold medicines. These may make you feel worse. Ask your pharmacist which medicines to avoid. General instructions Take over-the-counter and prescription medicines only as told by your health care provider. Keep all follow-up visits. This is important. Where to find support You can get help and support from these sources: Self-help groups. Online and [employer]. A trusted spiritual leader. Couples counseling. Family education classes. Family therapy. Where to find more information You may find that joining a support group helps you deal with your anxiety. The following sources can help you locate counselors or support groups near you: Mental Health America: www.mentalhealthamerica.net Anxiety and Depression Association of [place] (ADAA): ProgramCam.de [employer] on Mental Illness (NAMI): www.nami.org Contact a health care provider if: You have a hard time staying focused or finishing daily tasks. You spend many hours a day feeling worried about everyday life. You become exhausted by worry. You start to have headaches or frequently feel tense. You develop chronic nausea or diarrhea. Get help right away if: You have a racing heart and shortness of breath. You have thoughts of hurting yourself or others. If you ever feel like you may hurt yourself or others, or have thoughts about taking your own life, get help right away. Go to your nearest emergency department or: Call your local emergency services (911 in the [place] Call a  suicide crisis helpline, such as the National Suicide Prevention Lifeline at 845-420-4619 or 988 in the [place] This is open 24 hours a day in the [place] Text the Crisis Text Line at (706)478-2848 (in the [place] Summary Taking steps to learn and use tension reduction techniques can help calm you  and help prevent triggering an anxiety reaction. When used together, medicines, psychotherapy, and tension reduction techniques may be the most effective treatment. Family, friends, and partners can play a big part in supporting you. This information is not intended to replace advice given to you by your health care provider. Make sure you discuss any questions you have with your health care provider. Document Revised: 10/29/2020 Document Reviewed: 07/27/2020 Elsevier Patient Education  2023 [employer].

## 2022-03-29 NOTE — Progress Notes (Signed)
New Patient Office Visit  Subjective    Patient ID: Patrick Alexander, male    DOB: [birth year]  Age: 36 y.o. MRN: [id number]  CC: No chief complaint on file.   HPI Cecil Stalder presents to establish care [facility] 2 weeks  Intensive outpatient at [facility]  Couple of higher readings in the past ; family history ofof   Sleep is good with melatonin and hydroxyzine   Zoloft - 2.5 weeks  - can start to help with  Feel twitches at night - when starts to relax - feels stressed  - asperger tendencies  - stretching helps -  Water - plenty   Pink eye - scratched cornea two days ago with his nail  - cold compress and closing eyes helped , clear discharge - no matting    Hydroxyzine not helping as much    Outpatient Encounter Medications as of 03/29/2022  Medication Sig  . hydrOXYzine (ATARAX) 25 MG tablet Take 1 tablet (25 mg total) by mouth 3 (three) times daily.  . melatonin 3 MG TABS tablet Take 1 tablet (3 mg total) by mouth at bedtime.  . naltrexone (DEPADE) 50 MG tablet Take 1 tablet (50 mg total) by mouth at bedtime.  . nicotine (NICODERM CQ - DOSED IN MG/24 HR) 7 mg/24hr patch Place 1 patch (7 mg total) onto the skin in the morning.  . sertraline (ZOLOFT) 50 MG tablet Take 1 tablet (50 mg total) by mouth in the morning.   No facility-administered encounter medications on file as of 03/29/2022.    Past Medical History:  Diagnosis Date  . ADD (attention deficit disorder)   . Hemorrhoids 02/03/2015  . High risk sexual behavior 02/03/2015  . History of admission to inpatient psychiatry department 03/09/2022  . History of non-suicidal self-harm 03/09/2022  . 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 Tobacco use disorder 06/02/2015    Past Surgical History:  Procedure Laterality Date  . TONSILLECTOMY    . TYMPANOSTOMY TUBE PLACEMENT    . WISDOM TOOTH EXTRACTION      Family History  Problem Relation Age of Onset  . Allergies Mother   . Hyperlipidemia Mother   . Diabetes Mother    . Cancer Mother        endometrial  . Allergies Father   . Hypertension Father   . Stroke Father   . Depression Father   . Hyperlipidemia Father   . Allergies Sister   . Hyperlipidemia Sister     Social History   Socioeconomic History  . Marital status: Single    Spouse name: Not on file  . Number of children: Not on file  . Years of education: Not on file  . Highest education level: Not on file  Occupational History  . Not on file  Tobacco Use  . Smoking status: Every Day    Packs/day: 0.50    Types: Cigarettes    Last attempt to quit: 01/30/2015    Years since quitting: 7.1  . Smokeless tobacco: Not on file  Substance and Sexual Activity  . Alcohol use: Yes    Alcohol/week: 0.0 standard drinks of alcohol    Comment: social  . Drug use: No  . Sexual activity: Yes  Other Topics Concern  . Not on file  Social History Narrative  . Not on file   Social Determinants of Health   Financial Resource Strain: Not on file  Food Insecurity: Not on file  Transportation Needs: Not on file  Physical Activity: Not  on file  Stress: Not on file  Social Connections: Not on file  Intimate Partner Violence: Not on file    ROS      Objective    There were no vitals taken for this visit.  Physical Exam  {Labs (Optional):23779}    Assessment & Plan:   Problem List Items Addressed This Visit   None   No follow-ups on file.   Kasandra Knudsen Mayers, PA-C

## 2022-03-30 ENCOUNTER — Encounter: Payer: Self-pay | Admitting: Physician Assistant

## 2022-03-30 DIAGNOSIS — F5104 Psychophysiologic insomnia: Secondary | ICD-10-CM | POA: Insufficient documentation

## 2022-04-21 ENCOUNTER — Other Ambulatory Visit: Payer: Self-pay | Admitting: Physician Assistant

## 2022-04-21 ENCOUNTER — Ambulatory Visit: Payer: BLUE CROSS/BLUE SHIELD | Admitting: Physician Assistant

## 2022-04-21 ENCOUNTER — Encounter: Payer: Self-pay | Admitting: Physician Assistant

## 2022-04-21 VITALS — BP 138/83 | HR 70 | Ht 68.0 in | Wt 170.0 lb

## 2022-04-21 DIAGNOSIS — Z1159 Encounter for screening for other viral diseases: Secondary | ICD-10-CM

## 2022-04-21 DIAGNOSIS — F5104 Psychophysiologic insomnia: Secondary | ICD-10-CM

## 2022-04-21 DIAGNOSIS — F411 Generalized anxiety disorder: Secondary | ICD-10-CM

## 2022-04-21 DIAGNOSIS — Z111 Encounter for screening for respiratory tuberculosis: Secondary | ICD-10-CM

## 2022-04-21 DIAGNOSIS — R030 Elevated blood-pressure reading, without diagnosis of hypertension: Secondary | ICD-10-CM

## 2022-04-21 DIAGNOSIS — J302 Other seasonal allergic rhinitis: Secondary | ICD-10-CM

## 2022-04-21 DIAGNOSIS — F322 Major depressive disorder, single episode, severe without psychotic features: Secondary | ICD-10-CM

## 2022-04-21 DIAGNOSIS — F1020 Alcohol dependence, uncomplicated: Secondary | ICD-10-CM

## 2022-04-21 DIAGNOSIS — F902 Attention-deficit hyperactivity disorder, combined type: Secondary | ICD-10-CM

## 2022-04-21 MED ORDER — MELATONIN 3 MG PO TABS: 3.0000 mg | ORAL_TABLET | Freq: Every evening | ORAL | 1 refills | Status: DC | PRN

## 2022-04-21 MED ORDER — MELATONIN 3 MG PO TABS: 3.0000 mg | ORAL_TABLET | Freq: Every day | ORAL | 1 refills | Status: DC

## 2022-04-21 MED ORDER — SERTRALINE HCL 50 MG PO TABS: 50.0000 mg | ORAL_TABLET | Freq: Every morning | ORAL | 1 refills | Status: DC

## 2022-04-21 MED ORDER — CLONIDINE HCL 0.1 MG PO TABS
ORAL_TABLET | ORAL | 1 refills | Status: DC
  Filled 2022-05-21: qty 60, 30d supply, fill #0

## 2022-04-21 MED ORDER — NALTREXONE HCL 50 MG PO TABS: 50.0000 mg | ORAL_TABLET | Freq: Every day | ORAL | 1 refills | Status: DC

## 2022-04-21 MED ORDER — CETIRIZINE HCL 10 MG PO TABS: 10.0000 mg | ORAL_TABLET | Freq: Every day | ORAL | 1 refills | Status: AC

## 2022-04-21 NOTE — Progress Notes (Unsigned)
Established Patient Office Visit  Subjective   Patient ID: Patrick Alexander, male    DOB: 1985/07/18  Age: 37 y.o. MRN: [id number]  No chief complaint on file.   States that he continues to be treated for substance abuse at [facility] residential treatment center.  States that he is working on his transition.  States that he did learn last night that he is unable to go to [facility] due to his history of eating disorder and suicidal ideation.  States that he is working on other placements, and is considering going home to live with his parents and attending intensive outpatient therapy.  States that he feels that his mood is mostly stable, however does feel it could be better, states that he did start having a depressed mood after learning 12 hours ago that he was not going to be able to go to for [facility].  States that he was switched by the staff psychologist from hydroxyzine to clonidine.  States that he did not feel the hydroxyzine was offering any relief.  States that he has been checking his blood pressure on a daily basis, and since adding the clonidine his blood pressure readings have been within normal limits.  States that they were above normal limits, similar to last visits readings prior to that.  States that the clonidine does make him sleepy, and would prefer to take half a tablet in the morning half a tablet in the afternoon and a full tablet at bedtime.  States that he is sleeping well and does not feel he needs to also use the melatonin at this time.  States that he is taking the Zyrtec on a daily basis, states that his itchy eyes and sneezing has resolved.     Past Medical History:  Diagnosis Date   ADD (attention deficit disorder)    Hemorrhoids 02/03/2015   High risk sexual behavior 02/03/2015   History of admission to inpatient psychiatry department 03/09/2022   History of non-suicidal self-harm 03/09/2022   Sleep disorder 06/02/2015   Tobacco use disorder 06/02/2015    Social History   Socioeconomic History   Marital status: Single    Spouse name: Not on file   Number of children: Not on file   Years of education: Not on file   Highest education level: Not on file  Occupational History   Not on file  Tobacco Use   Smoking status: Every Day    Packs/day: 0.50    Types: Cigarettes    Last attempt to quit: 01/30/2015    Years since quitting: 7.2   Smokeless tobacco: Not on file  Substance and Sexual Activity   Alcohol use: Yes    Alcohol/week: 0.0 standard drinks of alcohol    Comment: social   Drug use: No   Sexual activity: Yes  Other Topics Concern   Not on file  Social History Narrative   Not on file   Social Determinants of Health   Financial Resource Strain: Not on file  Food Insecurity: Not on file  Transportation Needs: Not on file  Physical Activity: Not on file  Stress: Not on file  Social Connections: Not on file  Intimate Partner Violence: Not on file   Family History  Problem Relation Age of Onset   Allergies Mother    Hyperlipidemia Mother    Diabetes Mother    Cancer Mother        endometrial   Allergies Father    Hypertension Father  Stroke Father    Depression Father    Hyperlipidemia Father    Allergies Sister    Hyperlipidemia Sister    Allergies  Allergen Reactions   Amoxicillin Nausea And Vomiting   Penicillins Nausea And Vomiting    ROS    Objective:     There were no vitals taken for this visit.   Physical Exam   No results found for any visits on 04/21/22.  {Labs (Optional):23779}  The ASCVD Risk score (Arnett DK, et al., 2019) failed to calculate for the following reasons:   The 2019 ASCVD risk score is only valid for ages 66 to 74    Assessment & Plan:   Problem List Items Addressed This Visit   None   No follow-ups on file.    Loraine Grip Mayers, PA-C

## 2022-04-21 NOTE — Patient Instructions (Addendum)
I have changed the dosing of your clonidine, you can take 1/2-1 full pill every 8 hours, not to exceed 0.2 mg a day.  You will discontinue hydroxyzine at this time.  I encourage you to continue checking your blood pressure on a daily basis, keeping a written log and having available for all office visits.  You will change melatonin to as needed.  We will call you with today's lab results.  Kennieth Rad, PA-C Physician Assistant [facility] [email or web address]   Clonidine Tablets What is this medication? CLONIDINE (KLOE ni deen) treats high blood pressure. It works by relaxing blood vessels, which decreases blood pressure and the amount of work the heart has to do. This medicine may be used for other purposes; ask your health care provider or pharmacist if you have questions. COMMON BRAND NAME(S): Catapres What should I tell my care team before I take this medication? They need to know if you have any of these conditions: Kidney disease An unusual or allergic reaction to clonidine, other medications, foods, dyes, or preservatives Pregnant or trying to get pregnant Breast-feeding How should I use this medication? Take this medication by mouth. Take it as directed on the prescription label at the same time every day. You can take it with or without food. If it upsets your stomach, take it with food. Keep taking it unless your care team tells you to stop. Talk to your care team about the use of this medication in children. Special care may be needed. Overdosage: If you think you have taken too much of this medicine contact a poison control center or emergency room at once. NOTE: This medicine is only for you. Do not share this medicine with others. What if I miss a dose? If you miss a dose, take it as soon as you can. If it is almost time for your next dose, take only that dose. Do not take double or extra doses. What may interact  with this medication? Do not take this medication with any of the following: MAOIs, such as Carbex, Eldepryl, Marplan, Nardil, and Parnate This medication may also interact with the following: Barbiturate medications for inducing sleep or treating seizures, such as phenobarbital Certain medications for blood pressure, heart disease, or irregular heart beat Certain medications for mental health conditions Prescription pain medications This list may not describe all possible interactions. Give your health care provider a list of all the medicines, herbs, non-prescription drugs, or dietary supplements you use. Also tell them if you smoke, drink alcohol, or use illegal drugs. Some items may interact with your medicine. What should I watch for while using this medication? Visit your care team for regular checks on your progress. Check your heart rate and blood pressure as directed. Know what your heart rate and blood pressure should be, and when to contact your care team. This medication may affect your coordination, reaction time, or judgment. Do not drive or operate machinery until you know how this medication affects you. Sit up or stand slowly to reduce the risk of dizzy or fainting spells. Drinking alcohol with this medication can increase the risk of these side effects. Your mouth may get dry. Chewing sugarless gum or sucking hard candy and drinking plenty of water may help. Contact your care team if the problem doesn't go away or is severe. Do not treat yourself for coughs, colds, or pain while you are taking this medication without asking your care team for advice. Some medications may increase  your blood pressure. If you are going to have surgery, tell your care team that you are taking this medication. What side effects may I notice from receiving this medication? Side effects that you should report to your care team as soon as possible: Allergic reactions--skin rash, itching, hives, swelling  of the face, lips, tongue, or throat Low blood pressure--dizziness, feeling faint or lightheaded, blurry vision Slow heartbeat--dizziness, feeling faint or lightheaded, confusion, trouble breathing, unusual weakness or fatigue Side effects that usually do not require medical attention (report to your care team if they continue or are bothersome): Constipation Dizziness Drowsiness Dry eyes Dry mouth Fatigue This list may not describe all possible side effects. Call your doctor for medical advice about side effects. You may report side effects to FDA at 1-800-FDA-1088. Where should I keep my medication? Keep out of the reach of children and pets. Store at room temperature between 15 and 30 degrees C (59 and 86 degrees F). Throw away any unused medication after the expiration date. NOTE: This sheet is a summary. It may not cover all possible information. If you have questions about this medicine, talk to your doctor, pharmacist, or health care provider.  2023 Elsevier/Gold Standard (2021-11-04 00:00:00)

## 2022-04-22 DIAGNOSIS — F411 Generalized anxiety disorder: Secondary | ICD-10-CM | POA: Insufficient documentation

## 2022-04-22 DIAGNOSIS — J302 Other seasonal allergic rhinitis: Secondary | ICD-10-CM | POA: Insufficient documentation

## 2022-04-22 LAB — SPECIMEN STATUS REPORT

## 2022-04-22 LAB — HCV AB W REFLEX TO QUANT PCR: HCV Ab: NONREACTIVE

## 2022-04-22 LAB — HCV INTERPRETATION

## 2022-04-22 NOTE — Progress Notes (Signed)
Yes, Sorry I asked her about te TB I coped and pasted the wrong one. I was told it is in process

## 2022-04-25 LAB — QUANTIFERON-TB GOLD PLUS
QuantiFERON Mitogen Value: >10 IU/mL
QuantiFERON Nil Value: 0.01 IU/mL
QuantiFERON TB1 Ag Value: 0.01 IU/mL
QuantiFERON TB2 Ag Value: 0.01 IU/mL
QuantiFERON-TB Gold Plus: NEGATIVE

## 2022-04-25 LAB — SPECIMEN STATUS REPORT

## 2022-04-26 ENCOUNTER — Telehealth: Payer: Self-pay

## 2022-04-26 NOTE — Telephone Encounter (Signed)
Mr. Patrick Alexander results will be faxed over to [facility] the Rn at [facility] recovery center

## 2022-04-27 ENCOUNTER — Telehealth (HOSPITAL_COMMUNITY): Payer: Self-pay | Admitting: Licensed Clinical Social Worker

## 2022-04-27 NOTE — Telephone Encounter (Signed)
The therapist returns Patrick Alexander's call confirming his identity via two identifiers. He detoxed at [facility] and went to [facility] for almost six full weeks discharged on 04/23/22. He says that he does not have insurance currently. The therapist provides him with [employer] and arranges for him to be seen for an assessment on 05/03/22 at 1 p.m. He was admitted to the [facility] in November and had a CCA on 03/09/22.   Adam Phenix, [place], LCSW, [facility], [place] 04/27/2022

## 2022-04-27 NOTE — Telephone Encounter (Signed)
The therapist received a message yesterday that Patrick Alexander was inquiring about the CD IOP. Per notes in EPIC, he has been or is in residential treatment at [facility] and wanting to step down to [facility] IOP.  The therapist leaves a HIPAA-compliant voicemail with his contact numbers.  Adam Phenix, [place], LCSW, [facility], [place] 04/27/2022

## 2022-05-02 ENCOUNTER — Telehealth (HOSPITAL_COMMUNITY): Payer: Self-pay | Admitting: Licensed Clinical Social Worker

## 2022-05-02 NOTE — Telephone Encounter (Signed)
The therapist leave a HIPAA-compliant voicemail with Patrick Alexander letting him know that this therapist will be out of the office until at least 05/06/22 due to illness so asks that he call Monday morning to [phone] to see if he would like to change his appointment on Monday, 05/03/22 at 1 p.m. to a virtual visit or reschedule for this Thursday the 18th.   Adam Phenix, [place], LCSW, [facility], [place] 05/02/2022

## 2022-05-03 ENCOUNTER — Telehealth (INDEPENDENT_AMBULATORY_CARE_PROVIDER_SITE_OTHER): Payer: No Payment, Other | Admitting: Licensed Clinical Social Worker

## 2022-05-03 ENCOUNTER — Telehealth (HOSPITAL_COMMUNITY): Payer: Self-pay | Admitting: Licensed Clinical Social Worker

## 2022-05-03 DIAGNOSIS — F17210 Nicotine dependence, cigarettes, uncomplicated: Secondary | ICD-10-CM

## 2022-05-03 DIAGNOSIS — F1920 Other psychoactive substance dependence, uncomplicated: Secondary | ICD-10-CM

## 2022-05-03 DIAGNOSIS — F17200 Nicotine dependence, unspecified, uncomplicated: Secondary | ICD-10-CM

## 2022-05-03 DIAGNOSIS — F1020 Alcohol dependence, uncomplicated: Secondary | ICD-10-CM

## 2022-05-03 DIAGNOSIS — F902 Attention-deficit hyperactivity disorder, combined type: Secondary | ICD-10-CM

## 2022-05-03 DIAGNOSIS — F1220 Cannabis dependence, uncomplicated: Secondary | ICD-10-CM
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
## 2022-05-03 NOTE — Telephone Encounter (Signed)
Quintavius leaves a voicemail confirming that he is agreeable to having his in person appointment with this therapist for today at 1 p.m. changed to a tele-video visit.  Adam Phenix, [place], LCSW, [facility], [place] 05/03/2022

## 2022-05-05 ENCOUNTER — Encounter (HOSPITAL_COMMUNITY): Payer: Self-pay

## 2022-05-05 ENCOUNTER — Ambulatory Visit (HOSPITAL_COMMUNITY): Payer: BLUE CROSS/BLUE SHIELD

## 2022-05-05 ENCOUNTER — Telehealth (HOSPITAL_COMMUNITY): Payer: Self-pay | Admitting: Licensed Clinical Social Worker

## 2022-05-05 NOTE — Telephone Encounter (Signed)
The therapist leaves a HIPAA-compliant voicemail apologizing for class being cancelled today informing Patrick Alexander that he will be there on 05/07/22.  Adam Phenix, [place], LCSW, [facility], [place] 05/05/2022

## 2022-05-07 ENCOUNTER — Ambulatory Visit (INDEPENDENT_AMBULATORY_CARE_PROVIDER_SITE_OTHER): Payer: No Payment, Other | Admitting: Licensed Clinical Social Worker

## 2022-05-07 DIAGNOSIS — F17200 Nicotine dependence, unspecified, uncomplicated: Secondary | ICD-10-CM

## 2022-05-07 DIAGNOSIS — F1920 Other psychoactive substance dependence, uncomplicated: Secondary | ICD-10-CM

## 2022-05-07 DIAGNOSIS — F1020 Alcohol dependence, uncomplicated: Secondary | ICD-10-CM | POA: Diagnosis not present

## 2022-05-07 DIAGNOSIS — F1220 Cannabis dependence, uncomplicated: Secondary | ICD-10-CM

## 2022-05-07 DIAGNOSIS — F902 Attention-deficit hyperactivity disorder, combined type: Secondary | ICD-10-CM

## 2022-05-07 NOTE — Progress Notes (Signed)
Daily Group Progress Note   Program: CD IOP     Individual Time: 9 a.m. to 12 p.m.    Type of Therapy: Process and Psychoeducational    Topic: The therapist checks in with group members, assesses for SI/HI/psychosis and overall level of functioning. The therapist inquires about sobriety date and number of community support meetings attended since last session.    The therapist introduces a new member to group and discusses how a person must work as hard in recovery as he or she did at getting drunk or high and continues to talk about why a person gets a Publishing copy and how to choose one in addition to how to troubleshoot problems that one may have with his or her Sponsor.   The therapist explains why isolating for an addict is a high risk situation noting that people in recover are encouraged to call others when having a hard time which means calling others when the person does not really feel like doing so. The therapist notes that isolation and depression can become comfortable if one does not resist them and reach out to others. The therapist talks about how a break-up can be a risk to relapse and the need to avoid people, places, and things.    Summary: Patrick Alexander presents rating his depression as a "6" and anxiety as an "8."    He talks about how he benefited from attending Twelve Step meetings while inpatient at [facility] saying that meetings help you to "get a grip on" your "shit handle." He says that he needs to resume meetings but notes that he has an issue with social anxiety. He says that he likes NA better than AA.   Patrick Alexander says that he has two months of sobriety on Sunday noting that he is "always nervous." In addition to nervous, he says that his mood is "kind of playful."  He says that he is working on a house and opened the refrigerate to find a lot of beer and liquor saying that he had not urge to drink attributing this possibly to the medications he is taking such as naltrexone. He says that  he has had a tendency to "isolate bad" but is glad he is in group as "everyone's the same." He says that he does not like to be vulnerable and says that he needs to get a Sponsor encouraging other group members to not over think the process and just get one. He says that he is likely able to "trick" himself into going to a meeting by telling himself that he does not have an alcohol or drug problem but is going to the meeting to see a friend which apparently takes pressure off him.   Patrick Alexander shares about his experience in inpatient treatment and how they helped him to get on a schedule as to when to get up, eat, etcetera. He has an alarm go off on his phone in group telling him to eat lunch. He says that he was interested in going to [place] but was unable to get in.    Progress Towards Goals: Patrick Alexander reports no drug or alcohol use   UDS collected: No Results: No   AA/NA attended?: No   Sponsor?: No   Adam Phenix, MA, LCSW, [facility], [facility] 05/07/2022

## 2022-05-10 ENCOUNTER — Ambulatory Visit (INDEPENDENT_AMBULATORY_CARE_PROVIDER_SITE_OTHER): Payer: No Payment, Other | Admitting: Licensed Clinical Social Worker

## 2022-05-10 DIAGNOSIS — F1220 Cannabis dependence, uncomplicated: Secondary | ICD-10-CM

## 2022-05-10 DIAGNOSIS — F17200 Nicotine dependence, unspecified, uncomplicated: Secondary | ICD-10-CM

## 2022-05-10 DIAGNOSIS — F902 Attention-deficit hyperactivity disorder, combined type: Secondary | ICD-10-CM

## 2022-05-10 DIAGNOSIS — F1020 Alcohol dependence, uncomplicated: Secondary | ICD-10-CM

## 2022-05-10 DIAGNOSIS — F1920 Other psychoactive substance dependence, uncomplicated: Secondary | ICD-10-CM

## 2022-05-10 NOTE — Progress Notes (Signed)
Daily Group Progress Note   Program: [facility] IOP     Individual Time: 9 a.m. to 12 p.m.    Type of Therapy: Process and Psychoeducational    Topic: The therapist checks in with group members, assesses for SI/HI/psychosis and overall level of functioning. The therapist inquires about sobriety date and number of community support meetings attended since last session.    The therapist discusses the difficulty that many people have with giving up substance using friends while noting that it is not necessary to give up friends who are truly social drinkers though a person in early recovery likely does not need to be around even social drinking.   The therapist explains what is meant by co-dependency and how it can mask as being altruism but is not. The therapist educates group members of the dynamics of being an ACOA and how this relates to co-dependency stressing the importance of examining how one's family dynamics has impacted one's behavior in the present. The therapist recommends several good books aimed at helping with uncovering issues that may not be conscious. The therapist explains why many people who grew up with an addicted parent or parents often find themselves attracted to relationships with people actively in addiction talking about Freud's concept of the "repetition compulsion."  The therapist talks about how many people's sobriety is initially due to external constraints and why it is important that a person's recovery becomes internalized and one's own. The therapist answers a question concerning what is meant by a "dry drunk" providing numerous examples of what this looks like noting the importance of getting out of unhealthy relationships and being in healthy and supportive relationships without socially isolating.     Summary: Iban presents rating his depression as a "5" and anxiety as an "7."    He has not attended any meetings and does not have a Sponsor noting that he has been  working on a job all week painting then noting that this is "not an excuse." He says that as another member attended an on-line meeting that this is something that he can do today and can attend without turning on his camera.  He says that being a "dry drunk" for him is isolating and then thinking about everything. He says that he was recently drinking a Redbull and unconsciously crushed it like he used to do beer cans and panicked having to pull the can out of the trash to reassure himself that he was not drinking a beer.   Zalen says that both her and his mother are ACOAs noting that when his mother is around that he becomes like a baby lion cub who goes limp when it's mother takes it by the scruff. He describes her as being perfectionistic and controlling saying that he does better when he limits contact with her. He talks about growing up a preacher's child with the pressure being on doing for everyone else first.    Progress Towards Goals: Tyrail reports no drug or alcohol use   UDS collected: Yes Results: Yes, negative for drugs.   AA/NA attended?: No   Sponsor?: No   Adam Phenix, [place], [place], [facility], LCAS 05/10/2022

## 2022-05-12 ENCOUNTER — Ambulatory Visit (INDEPENDENT_AMBULATORY_CARE_PROVIDER_SITE_OTHER): Payer: No Payment, Other | Admitting: Licensed Clinical Social Worker

## 2022-05-12 DIAGNOSIS — F17200 Nicotine dependence, unspecified, uncomplicated: Secondary | ICD-10-CM

## 2022-05-12 DIAGNOSIS — F1920 Other psychoactive substance dependence, uncomplicated: Secondary | ICD-10-CM

## 2022-05-12 DIAGNOSIS — F902 Attention-deficit hyperactivity disorder, combined type: Secondary | ICD-10-CM

## 2022-05-12 DIAGNOSIS — F1220 Cannabis dependence, uncomplicated: Secondary | ICD-10-CM

## 2022-05-12 DIAGNOSIS — F1020 Alcohol dependence, uncomplicated: Secondary | ICD-10-CM | POA: Diagnosis not present

## 2022-05-12 NOTE — Progress Notes (Signed)
Daily Group Progress Note   Program: CD IOP     Individual Time: 9 a.m. to 12 p.m.    Type of Therapy: Process and Psychoeducational    Topic: The therapist checks in with group members, assesses for SI/HI/psychosis and overall level of functioning. The therapist inquires about sobriety date and number of community support meetings attended since last session.    The therapist answers group members' questions about the services provided at the [facility] including the urgent care downstairs and outpatient behavioral health services upstairs and how they will be connected to aftercare upon completing IOP as needed.   He again explains the reasons that it is best for a person with less than a year of sobriety, who is not already in a dating relationship to not enter into one and why it is especially frowned up for two newcomers in recovery to start dating.   The therapist uses a narrative therapy approach to illustrate things that a person's disease can tell him explaining why addiction is described as being "baffling and cunning." He stresses the importance to be able to discern the difference between a person's healthy self talking and a person's disease talking. He notes that many people with addiction tend to think of solitary activities to do as a means of coping with overwhelming feelings versus thinking of reaching out to others which normally does not come natural to them as many people in recovery have histories of trauma leading to trust issues. He notes that getting in the habit of calling others in the program when things are going well makes it easier to call when things are not going well. The therapist again educates members on the negative impact that tobacco use has in relation to long-term abstinence from alcohol.   Summary: Patrick Alexander presents rating his depression as a "3" and anxiety as an "3."    He attended a virtual NA meeting this morning but later admits that he felt somewhat "dirty"  in doing so mainly imaging his mother making some sort of negative comment about his attending NA. He says that he was raised to consider "what people in [place]" will "think."   At one point, he says that he believes that he is up to Step 4 though he has no Sponsor but later talks about times that he thinks, "I can do this; I'm fine" apparently having some denial about being an addict having to then remind himself of things that he did in the past to use and while using. The therapist notes that this sounds as though Patrick Alexander is still struggling with Step 1. Patrick Alexander also talks about trying to see himself as "no just an alcoholic" or a "drug addict." He says that he plans on doing another meeting tomorrow and realizes that doing a virtual meeting is not that time consuming; however he wants to attend in person.  He describes his emotions as "courageous" and "valued."    Progress Towards Goals: Patrick Alexander reports no drug or alcohol use   UDS collected: No Results: No   AA/NA attended?: Yes   Sponsor?: No   Patrick Alexander, [place], LCSW, [facility], [place] 05/12/2022

## 2022-05-14 ENCOUNTER — Other Ambulatory Visit: Payer: Self-pay

## 2022-05-14 ENCOUNTER — Ambulatory Visit (INDEPENDENT_AMBULATORY_CARE_PROVIDER_SITE_OTHER): Payer: No Payment, Other | Admitting: Licensed Clinical Social Worker

## 2022-05-14 ENCOUNTER — Encounter (HOSPITAL_COMMUNITY): Payer: Self-pay | Admitting: Licensed Clinical Social Worker

## 2022-05-14 VITALS — BP 138/83 | HR 70 | Ht 68.0 in | Wt 170.0 lb

## 2022-05-14 DIAGNOSIS — F1020 Alcohol dependence, uncomplicated: Secondary | ICD-10-CM | POA: Diagnosis not present

## 2022-05-14 DIAGNOSIS — T7422XS Child sexual abuse, confirmed, sequela: Secondary | ICD-10-CM

## 2022-05-14 DIAGNOSIS — F1920 Other psychoactive substance dependence, uncomplicated: Secondary | ICD-10-CM

## 2022-05-14 DIAGNOSIS — F1220 Cannabis dependence, uncomplicated: Secondary | ICD-10-CM

## 2022-05-14 DIAGNOSIS — Z6372 Alcoholism and drug addiction in family: Secondary | ICD-10-CM

## 2022-05-14 DIAGNOSIS — F4312 Post-traumatic stress disorder, chronic: Secondary | ICD-10-CM

## 2022-05-14 DIAGNOSIS — F17200 Nicotine dependence, unspecified, uncomplicated: Secondary | ICD-10-CM

## 2022-05-14 DIAGNOSIS — F902 Attention-deficit hyperactivity disorder, combined type: Secondary | ICD-10-CM

## 2022-05-14 MED ORDER — CLONIDINE HCL 0.1 MG PO TABS
0.0500 mg | ORAL_TABLET | Freq: Three times a day (TID) | ORAL | 1 refills | Status: DC | PRN
  Filled 2022-06-22: qty 60, 20d supply, fill #0

## 2022-05-14 MED ORDER — HYDROXYZINE HCL 25 MG PO TABS: 25.0000 mg | ORAL_TABLET | Freq: Three times a day (TID) | ORAL | 1 refills | Status: DC | PRN

## 2022-05-14 MED ORDER — ATOMOXETINE HCL 60 MG PO CAPS
60.0000 mg | ORAL_CAPSULE | Freq: Every day | ORAL | 2 refills | Status: DC
  Filled 2022-05-14: qty 30, 30d supply, fill #0
  Filled 2022-06-11: qty 30, 30d supply, fill #1

## 2022-05-14 MED ORDER — ATOMOXETINE HCL 60 MG PO CAPS
60.0000 mg | ORAL_CAPSULE | Freq: Every day | ORAL | 2 refills | Status: DC
  Filled 2022-05-14: qty 30, 30d supply, fill #0

## 2022-05-14 NOTE — Progress Notes (Signed)
Daily Group Progress Note   Program: CD IOP     Individual Time: 9 a.m. to 12 p.m.    Type of Therapy: Process and Psychoeducational    Topic: The therapist checks in with group members, assesses for SI/HI/psychosis and overall level of functioning. The therapist inquires about sobriety date and number of community support meetings attended since last session.    The therapist answers group members' questions about various substances such as Ketamine and "Tranq." He reads the NA Just for Today reading on self-centeredness facilitating a discussion of how this connects with persons with addiction's tendency to not trust people which leads to social isolation and a lack of reality testing. The therapist suggests that people in recovery overcome their discomfort with being around other people and sharing their feelings through exposure therapy if they "keep coming back."   The therapist has a Product/process development scientist today who is a former graduate of [facility] IOP with 11 months of sobriety who shares her story and answers questions from group members.    Summary: Patrick Alexander presents rating his depression as a "4" and anxiety as an "2."    He attended three meetings but has no Sponsor realizing that he has not gotten a Publishing copy as he has PTSD and thus issues with trusting people. The therapist addresses these trust issues and Patrick Alexander says that talking to the PA today also has made him more willing to get a Sponsor.  Towards the end of group when another group member is talking about needing to get a Sponsor this weekend, Patrick Alexander tells her, "If you will, I will."    Progress Towards Goals: Patrick Alexander reports no drug or alcohol use   UDS collected: No Results: No   AA/NA attended?: Yes   Sponsor?: No   Adam Phenix, [place], [place], [facility], [place] 05/14/2022
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
## 2022-05-17 ENCOUNTER — Ambulatory Visit (INDEPENDENT_AMBULATORY_CARE_PROVIDER_SITE_OTHER): Payer: No Payment, Other | Admitting: Behavioral Health

## 2022-05-17 DIAGNOSIS — F1020 Alcohol dependence, uncomplicated: Secondary | ICD-10-CM

## 2022-05-17 DIAGNOSIS — F1220 Cannabis dependence, uncomplicated: Secondary | ICD-10-CM

## 2022-05-17 DIAGNOSIS — F902 Attention-deficit hyperactivity disorder, combined type: Secondary | ICD-10-CM

## 2022-05-17 DIAGNOSIS — F1920 Other psychoactive substance dependence, uncomplicated: Secondary | ICD-10-CM

## 2022-05-17 DIAGNOSIS — F17200 Nicotine dependence, unspecified, uncomplicated: Secondary | ICD-10-CM

## 2022-05-17 NOTE — Progress Notes (Signed)
Daily Group Progress Note   Program: CD IOP     Individual Time: 9 a.m. to 12 p.m.    Type of Therapy: Process and Psychoeducational    Topic: The therapist checks in with group members, assesses for SI/HI/psychosis and overall level of functioning. The therapist inquires about sobriety date and number of community support meetings attended since last session.    The therapist covers an array of topics including answering questions about PAWS, explaining what is meant by living one's life with "intentionality," and emphasizing that if a person is assertive and the other person becomes upset that the person being assertive did not "cause" the other person's emotional reaction.   The therapist reads the NA Just for Today reading discussing how the First Step is an action step in that  if a person has taken this step then he will get rid of drug paraphernalia; avoid people, places, and things, etcetera. The therapist also explains that self-compassion is a key part of recovery and essential in avoiding emotional relapse.    The therapist shows a [employer] video illustrating that there is no such thing as a soft versus a hard drug and explains in depth what is meant by a standard drink.    Summary: Jerimie presents rating his depression as a "2" and anxiety as an "3."    He asks if the Strattera that he recently started would show up on a UDS with the therapist informing him that it will not as it is not a controlled substance. Kiernan has been to six meetings one of which was in person. He says that he also turned on his camera during a Zoom meeting and he picked up his 60 day NA key tag noting that about ten people hugged him.   He says that he is working on not being so self-critical saying that his anxiety ahs gone down. He says that when he gets stressed talking to people that his childhood stutter sometimes comes back but he is o.k. letting the person know that when anxious he will occasionally  stutter. He says that his mood is "content" and "determined." He plans on texting a guy he met in a meeting today to ask him if he will be his Sponsor noting that he made attempts this weekend which did not work out.   He says that he felt "shitty" that he was coming back to group without a Sponsor though does acknowledge that he did reach out with the therapist affirming that Otto should give himself credit for the effort he put in.   Jullien says that yesterday was a "shitty day" and that he wanted to buy some drugs or get drunk as he was feeling emotionally bad. He did not call anyone saying that he did not want his first outreach to be when he was not in a good place with the therapist noting that it would have been fine if he did. Oseas says that he realized that his emotions yesterday could have been related to PAWS which helped keep things in perspective.    Progress Towards Goals: Daijon reports no drug or alcohol use   UDS collected: Yes Results: Yes, negative for drugs   AA/NA attended?: Yes   Sponsor?: No   Adam Phenix, MA, LCSW, [facility], LCAS 05/17/2022

## 2022-05-19 ENCOUNTER — Ambulatory Visit (INDEPENDENT_AMBULATORY_CARE_PROVIDER_SITE_OTHER): Payer: No Payment, Other | Admitting: Licensed Clinical Social Worker

## 2022-05-19 DIAGNOSIS — F1220 Cannabis dependence, uncomplicated: Secondary | ICD-10-CM

## 2022-05-19 DIAGNOSIS — F17200 Nicotine dependence, unspecified, uncomplicated: Secondary | ICD-10-CM

## 2022-05-19 DIAGNOSIS — F1020 Alcohol dependence, uncomplicated: Secondary | ICD-10-CM | POA: Diagnosis not present

## 2022-05-19 DIAGNOSIS — F1920 Other psychoactive substance dependence, uncomplicated: Secondary | ICD-10-CM

## 2022-05-19 DIAGNOSIS — F902 Attention-deficit hyperactivity disorder, combined type: Secondary | ICD-10-CM

## 2022-05-19 NOTE — Progress Notes (Signed)
Daily Group Progress Note   Program: CD IOP     Individual Time: 9 a.m. to 12 p.m.    Type of Therapy: Process and Psychoeducational    Topic: The therapist checks in with group members, assesses for SI/HI/psychosis and overall level of functioning. The therapist inquires about sobriety date and number of community support meetings attended since last session.    The therapist introduces a new member to group and facilitates a discussion on self-talk and how to counter unhelpful self-talk as well as the role of encouraging self-talk and self-compassion in recovery.   The therapist suggests that understanding the reason that a person treats others badly is fine provided that it does not become an excuse for tolerating inexcusable behavior and not setting limits.    The therapist observes that a person in early recovery is not in a position to be caring for others with chronic health conditions as recovery is a full-time job. He educates group members on the systems in place to take care of disabled adults in need of protective services who lack the capacity to consent to these services.    Summary: Patrick Alexander presents rating his depression as a "3" and anxiety as an "3."    He says that he is starting to view accountability differently than before now seeing it more as something that he does for himself than for others. He notes that he now sees that he has "worth" and is not numbing. He admits that he will have conversations out loud with his mother when is by himself.   Patrick Alexander says that his mother is "cerebral" whereas he sees himself as more "reserved" and "laid back." He talks about his mother's constant anxiety and how she recently panicked over his father losing his phone at a restaurant. He says that he is trying to be more understanding of why she acts the way she does with the therapist noting that this is fine as long as he does not use this as an excuse to not set limits with her when she is  overstepping her bounds in criticizing him, etcetera.   Patrick Alexander says that he attended four meeting and test messaged the guy he wanted to be his Sponsor but he did not respond. He went to a new meeting which he loved and asked for a Sponsor with one guy telling Patrick Alexander that he needs to wait three weeks and listen in meetings before getting one with this therapist and other group members not agreeing with this feedback.  He says that his emotions are "determined" and "confident."    Progress Towards Goals: Patrick Alexander reports no drug or alcohol use   UDS collected: No  Results: No   AA/NA attended?: Yes   Sponsor?: No   Adam Phenix, [place], LCSW, [facility], LCAS 05/19/2022

## 2022-05-21 ENCOUNTER — Ambulatory Visit (INDEPENDENT_AMBULATORY_CARE_PROVIDER_SITE_OTHER): Payer: No Payment, Other | Admitting: Licensed Clinical Social Worker

## 2022-05-21 ENCOUNTER — Other Ambulatory Visit: Payer: Self-pay

## 2022-05-21 DIAGNOSIS — F902 Attention-deficit hyperactivity disorder, combined type: Secondary | ICD-10-CM

## 2022-05-21 DIAGNOSIS — F1020 Alcohol dependence, uncomplicated: Secondary | ICD-10-CM | POA: Diagnosis not present

## 2022-05-21 DIAGNOSIS — F17200 Nicotine dependence, unspecified, uncomplicated: Secondary | ICD-10-CM

## 2022-05-21 DIAGNOSIS — F1920 Other psychoactive substance dependence, uncomplicated: Secondary | ICD-10-CM

## 2022-05-21 DIAGNOSIS — F1220 Cannabis dependence, uncomplicated: Secondary | ICD-10-CM

## 2022-05-21 NOTE — Progress Notes (Signed)
Daily Group Progress Note   Program: CD IOP     Individual Time: 9 a.m. to 12 p.m.    Type of Therapy: Process and Psychoeducational    Topic: The therapist checks in with group members, assesses for SI/HI/psychosis and overall level of functioning. The therapist inquires about sobriety date and number of community support meetings attended since last session.    The therapist discusses the pitfalls of self-sponsorship, the reason that war stories are to be avoided at meetings, and how a person's ability to concentrate and reason improve as one gets more and more sobriety. The therapist talks about the role of overcoming conditioned guilt as it relates to self-care.  The therapist has group members view the video, "Breaking the Addiction Cycle" and complete the first two exercise.    Summary: Patrick Alexander presents rating his depression as a "2" and anxiety as an "3."    He has attended six meetings and joined a home group. He did not get a Publishing copy but plans on talking to a guy who has 45 years of sobriety. Jyair talks about being triggered by a woman at a meeting who told a war story for about a minute before the chair person shut her down. Celester notes that he experienced a "vulnerable time" and ended up sending a text to someone from the meetings saying that he now is starting to realize why a person needs to get numbers saying that these contacts serve as "distractions" from using.  He says that going to meetings gives him energy and support and describes his mood as "confident" and "determined." In doing the group exercises today, Reedy notes that one of his favorite rationalizations was that he could not be an addict or alcoholic given how well he could handing his drugs and hold his liquor seeing himself a genetically superior to real addicts.    Progress Towards Goals: Seanmichael reports no drug or alcohol use   UDS collected: No  Results: No   AA/NA attended?: Yes   Sponsor?: No   Adam Phenix, MA,  LCSW, [facility], LCAS 05/21/2022

## 2022-05-24 ENCOUNTER — Ambulatory Visit (INDEPENDENT_AMBULATORY_CARE_PROVIDER_SITE_OTHER): Payer: No Payment, Other | Admitting: Licensed Clinical Social Worker

## 2022-05-24 DIAGNOSIS — F1920 Other psychoactive substance dependence, uncomplicated: Secondary | ICD-10-CM

## 2022-05-24 DIAGNOSIS — F902 Attention-deficit hyperactivity disorder, combined type: Secondary | ICD-10-CM

## 2022-05-24 DIAGNOSIS — F17200 Nicotine dependence, unspecified, uncomplicated: Secondary | ICD-10-CM

## 2022-05-24 DIAGNOSIS — F1020 Alcohol dependence, uncomplicated: Secondary | ICD-10-CM | POA: Diagnosis not present

## 2022-05-24 DIAGNOSIS — F1220 Cannabis dependence, uncomplicated: Secondary | ICD-10-CM

## 2022-05-25 NOTE — Progress Notes (Signed)
Daily Group Progress Note   Program: CD IOP     Individual Time: 9 a.m. to 12 p.m.    Type of Therapy: Process and Psychoeducational    Topic: The therapist checks in with group members, assesses for SI/HI/psychosis and overall level of functioning. The therapist inquires about sobriety date and number of community support meetings attended since last session.    The therapist  continues showing the video, "Breaking the Addiction Cycle" and has group members complete and discuss exercises three and four with their homework to prepare to describe their using rituals at next group.   The therapist discusses the [place] Melter in relation to the disease of addiction being chronic and progressive and in relation to consequences of the disease occurring more and more frequently as the disease progresses.    Summary: Patrick Alexander presents rating his depression as a "3" and anxiety as an "2."    He reports no change in his sobriety date saying that his mood is "confident" and "encouraged." He notes that he has been to "only" four meetings since last group but does not have a Sponsor yet.   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 Patrick Alexander says that he caught himself thinking of taking her medications but told himself, "I don't need that." He admits that he used to go into people's bathrooms and look for controlled medications in their medicine cabinets.  Patrick Alexander says that the thing that got him to decide to enter treatment was that he had a bizarre, out-of-body experience while using that scared him so he asked his mother to take him to detox.    Progress Towards Goals: Patrick Alexander reports no drug or alcohol use   UDS collected: Yes  Results: No   AA/NA attended?: Yes   Sponsor?: No   Adam Phenix, MA, LCSW, [facility], LCAS 05/25/2022

## 2022-05-26 ENCOUNTER — Ambulatory Visit (INDEPENDENT_AMBULATORY_CARE_PROVIDER_SITE_OTHER): Payer: No Payment, Other | Admitting: Licensed Clinical Social Worker

## 2022-05-26 DIAGNOSIS — F1020 Alcohol dependence, uncomplicated: Secondary | ICD-10-CM | POA: Diagnosis not present

## 2022-05-26 NOTE — Addendum Note (Signed)
Addended by: Dara Hoyer on: 05/26/2022 06:06 PM   Modules accepted: Orders

## 2022-05-26 NOTE — Progress Notes (Signed)
Daily Group Progress Note   Program: CD IOP     Individual Time: 9 a.m. to 12 p.m.    Type of Therapy: Process and Psychoeducational    Topic: The therapist checks in with group members, assesses for SI/HI/psychosis and overall level of functioning. The therapist inquires about sobriety date and number of community support meetings attended since last session.    The therapist introduces a new group member and has existing members share their stories of how they came to be in [place] [facility]. The therapist again talks about how assertiveness connects to recovery and how it helps people to avoid entering emotional relapse via limit setting. He reviews the stages of relapse explaining why the program emphasizes picking up the phone and reaching out to people even when not thinking of using. He explains the brain-based reasons that people in early recovery must be especially diligent about avoiding triggers and on being smart not strong. He also discusses that research has determined that recovery takes place in fellowship and that having a non-using support system and using it is imperative.    Summary: Patrick Alexander presents rating his depression as a "3" and anxiety as an "4."    He reports no change in his sobriety date saying that he is "coming up" on 90 days of sobriety. He has been to four meetings but has not gotten a Publishing copy saying that he is mainly working and sleeping noting that he did not sleep well last night so feels "like crap." He says that he is thankful that he has a job and a place to sleep.  Patrick Alexander says that he feels "content," "driven," "eager," and "overwhelmed." He talks about being overwhelmed in relation to the feedback he gets at meetings. He says that four of five guys with a long time in recovery told him that he needed to get a Sponsor. The therapist suggests that Patrick Alexander could have responded by asking if any of them would be willing to Sponsor him seeing as they pointed this out to him.    Patrick Alexander, again, talks about not taking the elderly woman's controlled medications saying that he will "leave and obsess" about it later or will go to a convenience store that sells addictive substances and hurry out but think about it when he gets home. When the therapist points out that the program encourages people to call when thinking of using while adding that people should even call when in emotional relapse and not thinking of using, Patrick Alexander is resistant to this idea believing that doing so will only make him worse as the other person will just be listening to him obsess. The therapist points out that the other person he calls may not just passively listen but work to redirect him. Patrick Alexander is also resistant to the idea of changing jobs if needed if it means avoiding triggers.   He shares his story of how he got to treatment talking about his self-mutilation and "self-hate" as a teen and feeling like he was not good enough due to his mom's criticism and having "202" parents rather than 2 parents as a result of being a Pastor's child.    Progress Towards Goals: Patrick Alexander reports no drug or alcohol use   UDS collected: No  Results: No   AA/NA attended?: Yes   Sponsor?: No   Patrick Phenix, MA, LCSW, [facility], LCAS 05/26/2022

## 2022-05-28 ENCOUNTER — Ambulatory Visit (INDEPENDENT_AMBULATORY_CARE_PROVIDER_SITE_OTHER): Payer: No Payment, Other | Admitting: Licensed Clinical Social Worker

## 2022-05-28 DIAGNOSIS — F1020 Alcohol dependence, uncomplicated: Secondary | ICD-10-CM | POA: Diagnosis not present

## 2022-05-28 DIAGNOSIS — F1920 Other psychoactive substance dependence, uncomplicated: Secondary | ICD-10-CM

## 2022-05-28 DIAGNOSIS — F17200 Nicotine dependence, unspecified, uncomplicated: Secondary | ICD-10-CM

## 2022-05-28 DIAGNOSIS — F902 Attention-deficit hyperactivity disorder, combined type: Secondary | ICD-10-CM

## 2022-05-28 DIAGNOSIS — F1220 Cannabis dependence, uncomplicated: Secondary | ICD-10-CM

## 2022-05-28 NOTE — Progress Notes (Signed)
Daily Group Progress Note   Program: CD IOP     Individual Time: 9 a.m. to 12 p.m.    Type of Therapy: Process and Psychoeducational    Topic: The therapist checks in with group members, assesses for SI/HI/psychosis and overall level of functioning. The therapist inquires about sobriety date and number of community support meetings attended since last session.    The therapist introduces a new group member and, again, has existing members share their stories of how they came to be in [place] [facility]. The therapist facilitates discussion on what a home group  is, what is meant by a burning desire, why Sponsors have to the the same sex as Sponsees, what is meant by the phrase, "13th stepping," and what is meant by the "pink cloud." The therapist all talks about various phrases that are forms of denial such as "it's all natural." The therapist continues to explain the reason that addiction is an illness and not a character defect and normalizes people being nervous when they first come to treatment noting that many people with addiction do not initially like or trust people and many have issues with social anxiety that using substances alleviates.    Summary: [name] presents rating his depression as a "3" and anxiety as an "5."    He has attended 5 meetings and talks about having heard in a meeting that this is a "mental disease and not a moral dilemma." He describes his mood as "loving" and "valued." He says that he "kind of" has a Publishing copy but it becomes clear that he now has a Medical sales representative as he had difficulty getting a Sponsor at the meeting that he was attending with [name] realizing that he may need to start going to other meetings.   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 He also realizes that he never called [facility] as he was to do upon leaving treatment but plans on doing so. [name]  is praised for being proactive and it is apparent that some of the feedback that he received at last group has led to some behavior change.   In telling his recovery story, he adds some new information about his sister being the focus due to having had an eating disorder and about having had a blow-and-go on his vehicle for a year in the past. He says that he learned in treatment that he was not good at "emoting."    Progress Towards Goals: [name] reports no drug or alcohol use   UDS collected: No  Results: No   AA/NA attended?: Yes   Sponsor?: Yes   Adam Phenix, [place], [place], [facility], [place] 05/28/2022

## 2022-05-31 ENCOUNTER — Ambulatory Visit (INDEPENDENT_AMBULATORY_CARE_PROVIDER_SITE_OTHER): Payer: No Payment, Other | Admitting: Licensed Clinical Social Worker

## 2022-05-31 ENCOUNTER — Other Ambulatory Visit (HOSPITAL_COMMUNITY): Payer: Self-pay

## 2022-05-31 DIAGNOSIS — F17200 Nicotine dependence, unspecified, uncomplicated: Secondary | ICD-10-CM

## 2022-05-31 DIAGNOSIS — F1020 Alcohol dependence, uncomplicated: Secondary | ICD-10-CM | POA: Diagnosis not present

## 2022-05-31 DIAGNOSIS — F1920 Other psychoactive substance dependence, uncomplicated: Secondary | ICD-10-CM

## 2022-05-31 DIAGNOSIS — F902 Attention-deficit hyperactivity disorder, combined type: Secondary | ICD-10-CM

## 2022-05-31 DIAGNOSIS — F1220 Cannabis dependence, uncomplicated: Secondary | ICD-10-CM

## 2022-05-31 MED ORDER — NALTREXONE HCL 50 MG PO TABS
50.0000 mg | ORAL_TABLET | Freq: Every day | ORAL | 2 refills | Status: DC
  Filled 2022-05-31 – 2022-06-07 (×2): qty 30, 30d supply, fill #0

## 2022-05-31 MED ORDER — SERTRALINE HCL 100 MG PO TABS
200.0000 mg | ORAL_TABLET | Freq: Every day | ORAL | 2 refills | Status: DC
  Filled 2022-05-31 – 2022-06-07 (×2): qty 60, 30d supply, fill #0
  Filled 2022-07-07: qty 60, 30d supply, fill #1

## 2022-05-31 NOTE — Addendum Note (Signed)
Addended by: Dara Hoyer on: 05/31/2022 11:09 AM   Modules accepted: Orders

## 2022-06-01 NOTE — Progress Notes (Signed)
Daily Group Progress Note   Program: CD IOP     Individual Time: 9 a.m. to 12 p.m.    Type of Therapy: Process and Psychoeducational    Topic: The therapist checks in with group members, assesses for SI/HI/psychosis and overall level of functioning. The therapist inquires about sobriety date and number of community support meetings attended since last session.    The therapist has group members complete the exercise on sharing what their using rituals looked like. The therapist talks about lying in relation to addiction noting that addiction requires dishonesty.   The therapist answers questions about HIPAA and talks about the importance of family being involved in treatment when possible so as to understand the disease of addiction and to be supportive of one's need to attend meetings.    Summary: Patrick Alexander presents rating his depression as a "3" and anxiety as an "3."    He shares what his using ritual used to look at noting that he would stay home alone using drugs and alcohol occupying his time cleaning and doing research on-line. His sleep cycle was reversed as he would to to bed at 5 a.m. and  wake up at 11 p.m.   He has attended six meetings since last group and says that his mood is "content" and "motivated."    Progress Towards Goals: Patrick Alexander reports no drug or alcohol use   UDS collected: Yes  Results: Yes, negative for drugs   AA/NA attended?: Yes   Sponsor?: Yes   Adam Phenix, [place], [place], [facility], [place] 05/31/2022

## 2022-06-02 ENCOUNTER — Ambulatory Visit (INDEPENDENT_AMBULATORY_CARE_PROVIDER_SITE_OTHER): Payer: No Payment, Other | Admitting: Licensed Clinical Social Worker

## 2022-06-02 DIAGNOSIS — F17200 Nicotine dependence, unspecified, uncomplicated: Secondary | ICD-10-CM

## 2022-06-02 DIAGNOSIS — F1020 Alcohol dependence, uncomplicated: Secondary | ICD-10-CM | POA: Diagnosis not present

## 2022-06-02 DIAGNOSIS — F1220 Cannabis dependence, uncomplicated: Secondary | ICD-10-CM

## 2022-06-02 DIAGNOSIS — F1920 Other psychoactive substance dependence, uncomplicated: Secondary | ICD-10-CM

## 2022-06-02 DIAGNOSIS — F902 Attention-deficit hyperactivity disorder, combined type: Secondary | ICD-10-CM

## 2022-06-02 NOTE — Progress Notes (Signed)
Daily Group Progress Note   Program: CD IOP     Individual Time: 9 a.m. to 12 p.m.    Type of Therapy: Process and Psychoeducational    Topic: The therapist checks in with group members, assesses for SI/HI/psychosis and overall level of functioning. The therapist inquires about sobriety date and number of community support meetings attended since last session.    The therapist facilitates discussion on a number of topics including the phrase, "easy does it,' the challenges involved in examining one's character defects, the fact that addiction has a genetic component, what is meant by having a "reservation" in relation to one's recovery, the sugar cravings that many people experience in early recovery and how exercise can decrease the intensity and duration of PAWS. The therapist also talks about the fact that life is not neat and ordered by often times messy and that living life on life's terms can be challenging at time.    Summary: Patrick Alexander presents rating his depression as a "3" and anxiety as an "2."    He says that he has been to "three-and-a-half" meetings and that he ordered a Twelve Step NA Workbook that has yet to arrive. He talks about having a new "mindset" and trying to think of things as the "new me." He says that his temporary Sponsor has challenged [name] to listen to things discussed in meetings and ask the question, "How does that apply to me" which the therapist notes is very good advice. The therapist observes that Decameron seems to be looking ahead at steps that he is not yet working and encourages him to work one step at a time.  Hans says that his mood is "tired and tired." He says that he has 85 days of sobriety and notes that he is likely tired as one of the drugs he was using, likely Ecstasy, depleted his Serotonin. He says that taking 100 mg of Zoloft helps with the therapist informing him that there is room to go up on this dose if his medication prescriber feels this increase is  warranted.    Progress Towards Goals: Ethann reports no drug or alcohol use   UDS collected: No  Results: No  AA/NA attended?: Yes   Sponsor?: Yes   Adam Phenix, [place], [place], [facility], [place] 06/02/2022

## 2022-06-04 ENCOUNTER — Ambulatory Visit (INDEPENDENT_AMBULATORY_CARE_PROVIDER_SITE_OTHER): Payer: No Payment, Other | Admitting: Licensed Clinical Social Worker

## 2022-06-04 DIAGNOSIS — F1020 Alcohol dependence, uncomplicated: Secondary | ICD-10-CM | POA: Diagnosis not present

## 2022-06-04 DIAGNOSIS — F1920 Other psychoactive substance dependence, uncomplicated: Secondary | ICD-10-CM

## 2022-06-04 DIAGNOSIS — F17200 Nicotine dependence, unspecified, uncomplicated: Secondary | ICD-10-CM

## 2022-06-04 DIAGNOSIS — F1220 Cannabis dependence, uncomplicated: Secondary | ICD-10-CM

## 2022-06-04 DIAGNOSIS — F902 Attention-deficit hyperactivity disorder, combined type: Secondary | ICD-10-CM

## 2022-06-04 NOTE — Progress Notes (Signed)
Daily Group Progress Note   Program: CD IOP     Individual Time: 9 a.m. to 12 p.m.    Type of Therapy: Process and Psychoeducational    Topic: The therapist checks in with group members, assesses for SI/HI/psychosis and overall level of functioning. The therapist inquires about sobriety date and number of community support meetings attended since last session.    The therapist has group members finish watching the video, "Breaking the Addiction Cycle" challenging them to come up with ways to interrupt their own using rituals when in a trance-like state. The therapist also emphasizes the two biggest keys to recovery illustrated in the video while answering questions about process addictions  The therapist explains what is meant by the program being one of "attraction" but not "promotion." He shares the NA Just for Today reading for the day which focuses on the fact that bad things will still happen to people when they are in recovery but that they will find that they can learn to sit with uncomfortable emotions.    Summary: Patrick Alexander presents rating his depression as a "3" and anxiety as an "3."    He has been to four meetings and met with his Sponsor once informing his Sponsor that he is "mad at God." He describes his mood as being "gratified" and "energized."   Patrick Alexander says that one of his using rituals involves driving in the car as he would drive to get substances so driving can be triggering. The therapist questions Patrick Alexander concerning what things he could possibly due to remind himself that he is driving and in recovery such as hanging a reminder from his rear view window.    Progress Towards Goals: Patrick Alexander reports no drug or alcohol use   UDS collected: No  Results: No  AA/NA attended?: Yes   Sponsor?: Yes   Adam Phenix, [place], [place], [facility], [place] 06/04/2022

## 2022-06-07 ENCOUNTER — Other Ambulatory Visit (HOSPITAL_COMMUNITY): Payer: Self-pay

## 2022-06-07 ENCOUNTER — Ambulatory Visit (INDEPENDENT_AMBULATORY_CARE_PROVIDER_SITE_OTHER): Payer: No Payment, Other | Admitting: Licensed Clinical Social Worker

## 2022-06-07 ENCOUNTER — Other Ambulatory Visit: Payer: Self-pay

## 2022-06-07 DIAGNOSIS — F17200 Nicotine dependence, unspecified, uncomplicated: Secondary | ICD-10-CM

## 2022-06-07 DIAGNOSIS — F1220 Cannabis dependence, uncomplicated: Secondary | ICD-10-CM

## 2022-06-07 DIAGNOSIS — F1020 Alcohol dependence, uncomplicated: Secondary | ICD-10-CM | POA: Diagnosis not present

## 2022-06-07 DIAGNOSIS — F902 Attention-deficit hyperactivity disorder, combined type: Secondary | ICD-10-CM

## 2022-06-07 DIAGNOSIS — F1920 Other psychoactive substance dependence, uncomplicated: Secondary | ICD-10-CM

## 2022-06-07 NOTE — Progress Notes (Signed)
Daily Group Progress Note   Program: CD IOP     Individual Time: 9 a.m. to 11:40 a.m.    Type of Therapy: Process and Psychoeducational    Topic: The therapist checks in with group members, assesses for SI/HI/psychosis and overall level of functioning. The therapist inquires about sobriety date and number of community support meetings attended since last session.    The therapist addresses issues concerning how to deal with social anxiety in addition to facilitating a group discussion on what essentially is radical acceptance noting the saying, "suffering is inevitable but misery is optional."  The therapist has group members complete Matrix Model Recovery Checklist IC 2A.    Summary: Patrick Alexander presents rating his depression as a "3" and anxiety as an "5."    He has been to five meetings since his last group and is working with his Temporary Sponsor on his resistance to believing in and turning his life over to a [employer]. 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 Patrick Alexander realizes that these experiences are what led to his reluctance to believe in or trust a Higher Power.  He says that he has 90 days of sobriety today and asks for permission to leave a little early today to go to a noon meeting to pick-up his 90 day key tag. He says that he ran out of his anti-depressant this weekend but got it this morning. 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   Patrick Alexander says that he feels "accomplished" and "renewed."    Progress Towards Goals: Indi reports no drug or alcohol use   UDS collected: Yes  Results: Yes, negative for drugs  AA/NA  attended?: Yes   Sponsor?: Yes   Adam Phenix, [place], [place], [facility] [place], [place] 06/07/2022

## 2022-06-09 ENCOUNTER — Ambulatory Visit (INDEPENDENT_AMBULATORY_CARE_PROVIDER_SITE_OTHER): Payer: No Payment, Other | Admitting: Licensed Clinical Social Worker

## 2022-06-09 DIAGNOSIS — F1920 Other psychoactive substance dependence, uncomplicated: Secondary | ICD-10-CM

## 2022-06-09 DIAGNOSIS — F17200 Nicotine dependence, unspecified, uncomplicated: Secondary | ICD-10-CM

## 2022-06-09 DIAGNOSIS — F1220 Cannabis dependence, uncomplicated: Secondary | ICD-10-CM

## 2022-06-09 DIAGNOSIS — F902 Attention-deficit hyperactivity disorder, combined type: Secondary | ICD-10-CM

## 2022-06-09 DIAGNOSIS — F1020 Alcohol dependence, uncomplicated: Secondary | ICD-10-CM

## 2022-06-09 DIAGNOSIS — F4312 Post-traumatic stress disorder, chronic: Secondary | ICD-10-CM

## 2022-06-10 NOTE — Progress Notes (Signed)
Daily Group Progress Note   Program: CD IOP     Individual Time: 9 a.m. to 12 p.m.   Type of Therapy: Process and Psychoeducational    Topic: The therapist checks in with group members, assesses for SI/HI/psychosis and overall level of functioning. The therapist inquires about sobriety date and number of community support meetings attended since last session.    The therapist educates group members about a new, potentially dangerous legal drug, Tianeptine encouraging them to avoid it if they encounter it. The therapist primarily focuses on what is meant by being an "ACOA" and on how to deal with dysfunctional family dynamics via limit setting while noting that people can grieve for the family that they did not have and may possibly never have.    Summary: Claire presents rating his depression as a "4" and anxiety as an "4."    Jehan participates in the discussion concerning dysfunctional families focusing on his relationship with his mother noting that he has tended to do anything that he can to get positive reinforcement from her. The therapist suggests that when a person responds to abusive behavior via appeasement rather than limit setting that the person may actually be rewarding or positively reinforcing the negative behavior.   Maurizio says that he has attended five meetings since last group and describes his mood as "thankful" and "hopeful." He continues to work with his Medical sales representative.    Progress Towards Goals: Sayyid reports no drug or alcohol use   UDS collected: No  Results: No  AA/NA attended?: Yes   Sponsor?: Yes   Adam Phenix, [place], LCSW, [facility], [place] 06/09/2022

## 2022-06-11 ENCOUNTER — Ambulatory Visit (INDEPENDENT_AMBULATORY_CARE_PROVIDER_SITE_OTHER): Payer: No Payment, Other | Admitting: Licensed Clinical Social Worker

## 2022-06-11 ENCOUNTER — Other Ambulatory Visit: Payer: Self-pay

## 2022-06-11 DIAGNOSIS — F1920 Other psychoactive substance dependence, uncomplicated: Secondary | ICD-10-CM

## 2022-06-11 DIAGNOSIS — F17200 Nicotine dependence, unspecified, uncomplicated: Secondary | ICD-10-CM

## 2022-06-11 DIAGNOSIS — F1220 Cannabis dependence, uncomplicated: Secondary | ICD-10-CM

## 2022-06-11 DIAGNOSIS — F1020 Alcohol dependence, uncomplicated: Secondary | ICD-10-CM | POA: Diagnosis not present

## 2022-06-11 DIAGNOSIS — F4312 Post-traumatic stress disorder, chronic: Secondary | ICD-10-CM

## 2022-06-11 DIAGNOSIS — F902 Attention-deficit hyperactivity disorder, combined type: Secondary | ICD-10-CM
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
## 2022-06-14 ENCOUNTER — Ambulatory Visit (INDEPENDENT_AMBULATORY_CARE_PROVIDER_SITE_OTHER): Payer: No Payment, Other | Admitting: Licensed Clinical Social Worker

## 2022-06-14 DIAGNOSIS — F902 Attention-deficit hyperactivity disorder, combined type: Secondary | ICD-10-CM

## 2022-06-14 DIAGNOSIS — F1020 Alcohol dependence, uncomplicated: Secondary | ICD-10-CM

## 2022-06-14 DIAGNOSIS — F17200 Nicotine dependence, unspecified, uncomplicated: Secondary | ICD-10-CM

## 2022-06-14 DIAGNOSIS — F1220 Cannabis dependence, uncomplicated: Secondary | ICD-10-CM

## 2022-06-14 DIAGNOSIS — F1920 Other psychoactive substance dependence, uncomplicated: Secondary | ICD-10-CM

## 2022-06-14 NOTE — Progress Notes (Signed)
Daily Group Progress Note   Program: CD IOP     Individual Time: 9 a.m. to 12 p.m.   Type of Therapy: Process and Psychoeducational    Topic: The therapist checks in with group members, assesses for SI/HI/psychosis and overall level of functioning. The therapist inquires about sobriety date and number of community support meetings attended since last session.   The therapist discusses the consequences of getting a DWI which three of the four group members have experienced. The therapist notes that working Step Three is challenging for many people especially if people have trauma associated with organized religion and "God." For example, there are people who have been traumatized at [facility] or who believe that God was not to be found when they were suffering or people who have an adverse reaction to "God" having been forced to go to [place], [employer] forces training and education officer.  The therapist facilitates a discussion on when to get a Sponsor and how to navigate issues when a person feels confused by his or her Sponsor's direction.   Summary: Jacksen presents rating his depression as a "3" and anxiety as an "4."    The therapist informs [place] that his current CD IOP authorization runs out on 06/18/22 with the therapist attempting to assess where Eartha is in regard to being able to stepdown from the program. Tryg says that attending CD IOP helps to "regulate" him so says that he is not ready to stepdown from group. The therapist then questions how Sayge will define when he is ready to stepdown from group with Bernerd eventually understanding that his ability to continue is based more on medical necessity criteria than simply a desire to continue.   Swayde says that he is trying to work Step 3 which is difficult for him noting that he has to "control everything." Another group member provides feedback to [employer] based on his own experience with working Step 3.  Letcher says that [facility] called him back but when he told them that he was  working on [employer] job, they asked him to call them back once the job is done which he indicates that he will do. He says that his mood is "creative" and "adventurous."    Progress Towards Goals: Jayz reports no drug or alcohol use   UDS collected: Yes  Results: Yes, negative for drugs  AA/NA attended?: Yes   Sponsor?: Yes   Adam Phenix, [place], [place], [facility], [place] 06/14/2022

## 2022-06-16 ENCOUNTER — Ambulatory Visit (INDEPENDENT_AMBULATORY_CARE_PROVIDER_SITE_OTHER): Payer: No Payment, Other | Admitting: Licensed Clinical Social Worker

## 2022-06-16 DIAGNOSIS — F1220 Cannabis dependence, uncomplicated: Secondary | ICD-10-CM

## 2022-06-16 DIAGNOSIS — F17200 Nicotine dependence, unspecified, uncomplicated: Secondary | ICD-10-CM

## 2022-06-16 DIAGNOSIS — F1020 Alcohol dependence, uncomplicated: Secondary | ICD-10-CM | POA: Diagnosis not present

## 2022-06-16 DIAGNOSIS — F1920 Other psychoactive substance dependence, uncomplicated: Secondary | ICD-10-CM

## 2022-06-16 DIAGNOSIS — F902 Attention-deficit hyperactivity disorder, combined type: Secondary | ICD-10-CM

## 2022-06-16 NOTE — Progress Notes (Signed)
Daily Group Progress Note   Program: CD IOP     Individual Time: 9 a.m. to 12 p.m.   Type of Therapy: Process and Psychoeducational    Topic: The therapist checks in with group members, assesses for SI/HI/psychosis and overall level of functioning. The therapist inquires about sobriety date and number of community support meetings attended since last session.   The therapist focuses again on why learning to become assertive is critical in regard to developing healthy relationships which is turn is critical to one's recovery giving examples of this while modeling assertive responses. He discusses the reason that working the 4th Step is so challenging for people and also discusses the problems that arise when getting sober while one's partner or significant other does not. The therapist educates group members on how to use radical acceptance" and educates them on what an [facility] is and how it can be beneficial to many in recovery.     Summary: Patrick Alexander presents rating his depression as a "5" and anxiety as an "3."    Patrick Alexander admits that he has had difficulty with saying "no" to people without feeling guilty so sees this as an area on which he needs to work. He shares a story about his sister inviting him to come to her house for what sounded like a social event with Patrick Alexander declining only later for her to reveal her hidden agenda of wanting him to babysit her kids.   He has attended three meetings and is working Step 3 and about to work Step 4. After talking to his Sponsor, he now sees that he cannot just block out his past and not think about it but must visit the past "some" to think back to times in the past when he tried to control things that he could not and should have turned over to his Higher Power. The therapist informs him that there is no way to work Step 4 without reviewing one's past also.   Patrick Alexander says that he feels "provoked" to think about his past and "loving." He indicates that he is  ready to discharge from CD IOP on 06/18/22 saying that he wants to get an appointment with a medication prescriber here and continue NA declining outpatient therapy at this time though being informed of its availability.   Progress Towards Goals: Patrick Alexander reports no drug or alcohol use   UDS collected: No  Results: No  AA/NA attended?: Yes   Sponsor?: Yes   Adam Phenix, [place], LCSW, [facility], [place] 06/16/2022

## 2022-06-18 ENCOUNTER — Ambulatory Visit (INDEPENDENT_AMBULATORY_CARE_PROVIDER_SITE_OTHER): Payer: No Payment, Other | Admitting: Licensed Clinical Social Worker

## 2022-06-18 DIAGNOSIS — F1020 Alcohol dependence, uncomplicated: Secondary | ICD-10-CM

## 2022-06-18 DIAGNOSIS — F1920 Other psychoactive substance dependence, uncomplicated: Secondary | ICD-10-CM

## 2022-06-18 DIAGNOSIS — F17200 Nicotine dependence, unspecified, uncomplicated: Secondary | ICD-10-CM

## 2022-06-18 DIAGNOSIS — F902 Attention-deficit hyperactivity disorder, combined type: Secondary | ICD-10-CM

## 2022-06-18 DIAGNOSIS — F1220 Cannabis dependence, uncomplicated: Secondary | ICD-10-CM
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
## 2022-06-18 NOTE — Progress Notes (Signed)
Daily Group Progress Note   Program: CD IOP     Individual Time: 9 a.m. to 12 p.m.   Type of Therapy: Process and Psychoeducational    Topic: The therapist checks in with group members, assesses for SI/HI/psychosis and overall level of functioning. The therapist inquires about sobriety date and number of community support meetings attended since last session.   The therapist talks about how one goes about choosing a Sponsor and that one has to find the right fit so may have to change Sponsors which is okay. He educates group members on the long-term impact that alcohol has on a person's sleep pattern and how long it will take for this to return to normal. He emphasizes that no matter what organization a person joins that as long as it has people that none will be perfect and there will always be challenging people. He shares research about recovery taking place in Fellowship but notes that this Fellowship does not just have to be AA or NA but there are other options for developing a non-using support system.   The therapist introduces two new group members today while saying goodbye to two who are graduating the program.    Summary: Yousuf presents rating his depression as a "4" and anxiety as an "4."    Chisum schedules a medication appointment for his aftercare. He says that he feels "exhausted" but also "courageous" and "hopeful." He says that his father fell and has fallen a couple of times recently which could be due to [place] or from his chemotherapy. Thus, Clester says that he is on alert at night due to worrying about his father falling.  He has attended four meetings and is working on Step 3. He says that he is going to fill the time that he spend in CD IOP probably attending more in person meetings. 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  Kaspen concludes that the advice he would give someone based on his time in CD IOP is to attending a lot of meetings and to have  structure in one's day noting the benefits of his daily meditation and schedule    Progress Towards Goals: Dakkota reports no drug or alcohol use   UDS collected: No  Results: No  AA/NA attended?: Yes   Sponsor?: Yes   Adam Phenix, [place], [place], [facility], [place] 06/18/2022

## 2022-06-21 ENCOUNTER — Encounter (HOSPITAL_COMMUNITY): Payer: Self-pay

## 2022-06-21 ENCOUNTER — Ambulatory Visit (HOSPITAL_COMMUNITY): Payer: BLUE CROSS/BLUE SHIELD | Admitting: Licensed Clinical Social Worker

## 2022-06-22 ENCOUNTER — Other Ambulatory Visit: Payer: Self-pay

## 2022-06-23 ENCOUNTER — Ambulatory Visit (HOSPITAL_COMMUNITY): Payer: BLUE CROSS/BLUE SHIELD

## 2022-06-25 ENCOUNTER — Ambulatory Visit (HOSPITAL_COMMUNITY): Payer: BLUE CROSS/BLUE SHIELD

## 2022-07-02 ENCOUNTER — Encounter (HOSPITAL_COMMUNITY): Payer: Self-pay | Admitting: Licensed Clinical Social Worker

## 2022-07-07 ENCOUNTER — Other Ambulatory Visit: Payer: Self-pay

## 2022-07-08 ENCOUNTER — Other Ambulatory Visit (HOSPITAL_COMMUNITY): Payer: Self-pay

## 2022-07-08 ENCOUNTER — Ambulatory Visit (INDEPENDENT_AMBULATORY_CARE_PROVIDER_SITE_OTHER): Payer: No Payment, Other | Admitting: Physician Assistant

## 2022-07-08 ENCOUNTER — Encounter (HOSPITAL_COMMUNITY): Payer: Self-pay | Admitting: Physician Assistant

## 2022-07-08 ENCOUNTER — Other Ambulatory Visit: Payer: Self-pay

## 2022-07-08 VITALS — BP 125/91 | HR 83 | Ht 68.0 in | Wt 161.0 lb

## 2022-07-08 DIAGNOSIS — F331 Major depressive disorder, recurrent, moderate: Secondary | ICD-10-CM

## 2022-07-08 DIAGNOSIS — F902 Attention-deficit hyperactivity disorder, combined type: Secondary | ICD-10-CM | POA: Diagnosis not present

## 2022-07-08 DIAGNOSIS — F1920 Other psychoactive substance dependence, uncomplicated: Secondary | ICD-10-CM | POA: Diagnosis not present

## 2022-07-08 DIAGNOSIS — F1020 Alcohol dependence, uncomplicated: Secondary | ICD-10-CM | POA: Diagnosis not present

## 2022-07-08 DIAGNOSIS — F17200 Nicotine dependence, unspecified, uncomplicated: Secondary | ICD-10-CM | POA: Diagnosis not present

## 2022-07-08 DIAGNOSIS — F4312 Post-traumatic stress disorder, chronic: Secondary | ICD-10-CM

## 2022-07-08 DIAGNOSIS — F411 Generalized anxiety disorder: Secondary | ICD-10-CM

## 2022-07-08 MED ORDER — SERTRALINE HCL 100 MG PO TABS
200.0000 mg | ORAL_TABLET | Freq: Every day | ORAL | 1 refills | Status: DC
  Filled 2022-07-08 – 2022-08-02 (×2): qty 60, 30d supply, fill #0

## 2022-07-08 MED ORDER — NALTREXONE HCL 50 MG PO TABS
50.0000 mg | ORAL_TABLET | Freq: Every day | ORAL | 1 refills | Status: DC
  Filled 2022-07-08 – 2022-08-02 (×2): qty 30, 30d supply, fill #0

## 2022-07-08 MED ORDER — CLONIDINE HCL 0.1 MG PO TABS
0.0500 mg | ORAL_TABLET | Freq: Three times a day (TID) | ORAL | 1 refills | Status: DC | PRN
  Filled 2022-07-08 – 2022-07-23 (×2): qty 60, 20d supply, fill #0

## 2022-07-08 MED ORDER — ATOMOXETINE HCL 60 MG PO CAPS
60.0000 mg | ORAL_CAPSULE | Freq: Every day | ORAL | 1 refills | Status: DC
  Filled 2022-07-08 (×2): qty 30, 30d supply, fill #0
  Filled 2022-08-05: qty 30, 30d supply, fill #1
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
## 2022-07-23 ENCOUNTER — Other Ambulatory Visit: Payer: Self-pay

## 2022-08-02 ENCOUNTER — Other Ambulatory Visit: Payer: Self-pay

## 2022-08-05 ENCOUNTER — Other Ambulatory Visit: Payer: Self-pay

## 2022-08-19 ENCOUNTER — Other Ambulatory Visit: Payer: Self-pay

## 2022-08-19 ENCOUNTER — Ambulatory Visit (INDEPENDENT_AMBULATORY_CARE_PROVIDER_SITE_OTHER): Payer: No Payment, Other | Admitting: Physician Assistant

## 2022-08-19 DIAGNOSIS — F4312 Post-traumatic stress disorder, chronic: Secondary | ICD-10-CM

## 2022-08-19 DIAGNOSIS — F1020 Alcohol dependence, uncomplicated: Secondary | ICD-10-CM

## 2022-08-19 DIAGNOSIS — F411 Generalized anxiety disorder: Secondary | ICD-10-CM

## 2022-08-19 DIAGNOSIS — F902 Attention-deficit hyperactivity disorder, combined type: Secondary | ICD-10-CM

## 2022-08-19 DIAGNOSIS — F331 Major depressive disorder, recurrent, moderate: Secondary | ICD-10-CM

## 2022-08-19 MED ORDER — ATOMOXETINE HCL 80 MG PO CAPS
80.0000 mg | ORAL_CAPSULE | Freq: Every day | ORAL | 2 refills | Status: DC
  Filled 2022-08-19: qty 30, 30d supply, fill #0
  Filled 2022-09-17: qty 30, 30d supply, fill #1
  Filled 2022-10-14: qty 30, 30d supply, fill #2

## 2022-08-19 MED ORDER — CLONIDINE HCL 0.1 MG PO TABS
0.0500 mg | ORAL_TABLET | Freq: Three times a day (TID) | ORAL | 2 refills | Status: DC | PRN
  Filled 2022-08-19: qty 60, 30d supply, fill #0
  Filled 2022-09-23 (×2): qty 60, 30d supply, fill #1

## 2022-08-19 MED ORDER — SERTRALINE HCL 100 MG PO TABS
200.0000 mg | ORAL_TABLET | Freq: Every day | ORAL | 2 refills | Status: DC
  Filled 2022-08-19 – 2022-08-31 (×2): qty 60, 30d supply, fill #0
  Filled 2022-09-30: qty 60, 30d supply, fill #1

## 2022-08-19 MED ORDER — NALTREXONE HCL 50 MG PO TABS
50.0000 mg | ORAL_TABLET | Freq: Every day | ORAL | 2 refills | Status: DC
  Filled 2022-08-19 – 2022-08-31 (×2): qty 30, 30d supply, fill #0
  Filled 2022-09-30: qty 30, 30d supply, fill #1

## 2022-08-19 NOTE — Progress Notes (Signed)
BH MD/PA/NP OP Progress Note  08/22/2022 9:22 PM Patrick Alexander  MRN:  [id number]  Chief Complaint:  Chief Complaint  Patient presents with   Follow-up   Medication Management   HPI:   Patrick Alexander is a 37 year old, Caucasian male with a past psychiatric history significant for attention deficit hyperactivity disorder (combined type), alcohol use disorder (severe/dependence, in early remission), generalized anxiety disorder, chronic PTSD, and major depressive disorder who presents to [facility] for follow-up and medication management.  Patient is currently being managed on the following psychiatric medications:  Strattera 60 mg daily Naltrexone 50 mg daily Sertraline 200 mg daily Clonidine 0.1 mg (0.5 to 1 tablet) by mouth 3 times daily as needed  Patient reports that he has been busy and working.  He reports that his naltrexone has been helpful in managing his alcohol use disorder.  Patient reports that he has no desire to drink alcohol when faced with the option.  Patient reports that there was an incident where he was helping his sister clean up her place and there were many tequila bottles everywhere.  During that incident, patient states that he had no urge to drink any of the bottles around him.  Patient reports that his Zoloft has also been helpful in managing his mood.  Patient states that he is able to better regulate his emotions through the use of sertraline.  He reports that he occasionally experiences negative symptoms or sadness but states that the emotions do not stay as long.  Patient rates his depression a 3 or 4 out of 10.  Patient endorses depressive episodes 1 to 2 days out of the week.  He reports that the more he stays busy, the better his mood is.  Patient endorses the following depressive symptoms: inability to convey emotions and isolative.  Patient reports that he has been attending NA meetings as well as virtual meetings.   Patient endorses anxiety and rates his anxiety as 3 or 4 out of 10.  Patient reports that the use of clonidine has been managing his mental anxiety.  Patient is interested in increasing his dosage of Strattera for management of his concentration. A GAD-7 screen was performed with the patient scoring a 4.  Patient is alert and oriented x 4, pleasant, calm, cooperative, and fully engaged in conversation during the encounter.  Patient endorses pleasant mood and states that he feels more confident.  Patient endorses more assurance in the way he feels.  Patient denies suicidal or homicidal ideations.  He occasionally endorses visual hallucinations when working and dehydrated/hungry.  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  Patient denies active auditory or visual hallucinations at this time and does not appear to be responding to internal/external stimuli.  Patient endorses good sleep and receives on average 7 to 8 hours of sleep per night.  Patient endorses good appetite and eats on average 2-3 meals per day.  Patient denies alcohol consumption and illicit drug use.  Patient endorses tobacco use and smokes on average 10 cigarettes/day.  Visit Diagnosis:    ICD-10-CM   1. Attention deficit hyperactivity disorder (ADHD), combined type  F90.2 atomoxetine (STRATTERA) 80 MG capsule    cloNIDine (CATAPRES) 0.1 MG tablet    2. Alcohol use disorder, severe, dependence (HCC)  F10.20 naltrexone (DEPADE) 50 MG tablet    3. Generalized anxiety disorder  F41.1 sertraline (ZOLOFT) 100  MG tablet    cloNIDine (CATAPRES) 0.1 MG tablet    4. Chronic post-traumatic stress disorder (PTSD)  F43.12 sertraline (ZOLOFT) 100 MG tablet    5. Moderate episode of recurrent major depressive disorder (HCC)  F33.1 sertraline (ZOLOFT) 100 MG tablet      Past Psychiatric History:  Patient has a past Patrick Alexander history significant for  attention deficit hyperactivity disorder, major depressive disorder, PTSD, polysubstance abuse, and generalized anxiety disorder   Patient reports that he has a past history of hospitalization due to mental health.  Patient reports that the hospitalization occurred around the time he was 55 when he had moved to [place].  Patient reports that he is originally from a [address] town and once he moved to [place], he wanted to leave and by experimenting with drugs.  Patient states that he grew up sheltered and once he moved to [place], he states that he rebelled against the environment he grew up in.   Patient denies a past history of suicide attempt but states that he has had suicidal thoughts in the past   Patient denies a past history of homicide  Past Medical History:  Past Medical History:  Diagnosis Date   ADD (attention deficit disorder)    Hemorrhoids 02/03/2015   High risk sexual behavior 02/03/2015   History of admission to inpatient psychiatry department 03/09/2022   History of non-suicidal self-harm 03/09/2022   Sleep disorder 06/02/2015   Tobacco use disorder 06/02/2015    Past Surgical History:  Procedure Laterality Date   TONSILLECTOMY     TYMPANOSTOMY TUBE PLACEMENT     WISDOM TOOTH EXTRACTION      Family Psychiatric History:  Patient reports that mental health runs on his father's side of the family.  Patient reports that PTSD and generalized anxiety are common on his father's side of the family   Father - Generalized anxiety disorder and major depressive disorder Grandfather (paternal) - Generalized anxiety disorder and major depressive disorder.  Patient reports that his grandfather had a past psychiatric history of paranoid schizophrenia and spent extended periods of time in the hospital   Family history of suicide attempt: Patient denies Family history of homicide: Patient denies Family history of substance abuse: Patient reports that his cousins on his mother's  side of the family all struggle with substance abuse  Family History:  Family History  Problem Relation Age of Onset   Allergies Mother    Hyperlipidemia Mother    Diabetes Mother    Cancer Mother        endometrial   Allergies Father    Hypertension Father    Stroke Father    Depression Father    Hyperlipidemia Father    Allergies Sister    Hyperlipidemia Sister     Social History:  Social History   Socioeconomic History   Marital status: Single    Spouse name: Not on file   Number of children: Not on file   Years of education: Not on file   Highest education level: Not on file  Occupational History   Not on file  Tobacco Use   Smoking status: Every Day    Packs/day: .5    Types: Cigarettes    Last attempt to quit: 01/30/2015    Years since quitting: 7.5   Smokeless tobacco: Not on file  Substance and Sexual Activity   Alcohol use: Yes    Alcohol/week: 0.0 standard drinks of alcohol    Comment: social   Drug use: No  Sexual activity: Yes  Other Topics Concern   Not on file  Social History Narrative   Not on file   Social Determinants of Health   Financial Resource Strain: Not on file  Food Insecurity: Not on file  Transportation Needs: Not on file  Physical Activity: Not on file  Stress: Not on file  Social Connections: Not on file    Allergies:  Allergies  Allergen Reactions   Amoxicillin Nausea And Vomiting   Penicillins Nausea And Vomiting    Metabolic Disorder Labs: Lab Results  Component Value Date   HGBA1C 5.3 03/09/2022   MPG 105.41 03/09/2022   No results found for: "PROLACTIN" Lab Results  Component Value Date   CHOL 192 03/09/2022   TRIG 104 03/09/2022   HDL 73 03/09/2022   CHOLHDL 2.6 03/09/2022   VLDL 21 03/09/2022   LDLCALC 98 03/09/2022   LDLCALC 120 04/23/2015   Lab Results  Component Value Date   TSH 2.539 03/09/2022    Therapeutic Level Labs: No results found for: "LITHIUM" No results found for:  "VALPROATE" No results found for: "CBMZ"  Current Medications: Current Outpatient Medications  Medication Sig Dispense Refill   atomoxetine (STRATTERA) 80 MG capsule Take 1 capsule (80 mg total) by mouth daily. 30 capsule 2   cetirizine (ZYRTEC ALLERGY) 10 MG tablet Take 1 tablet (10 mg total) by mouth daily. 30 tablet 1   cloNIDine (CATAPRES) 0.1 MG tablet Take 0.5-1 tablets (0.05-0.1 mg total) by mouth 3 (three) times daily as needed.Not to exceed 2 full tablets per day. 60 tablet 2   hydrOXYzine (ATARAX) 25 MG tablet Take 1-2 tablets (25-50 mg total) by mouth 3 (three) times daily as needed. 90 tablet 1   melatonin 3 MG TABS tablet Take 1 tablet (3 mg total) by mouth at bedtime as needed. 30 tablet 1   naltrexone (DEPADE) 50 MG tablet Take 1 tablet (50 mg total) by mouth at bedtime. 30 tablet 2   sertraline (ZOLOFT) 100 MG tablet Take 2 tablets (200 mg total) by mouth daily. 60 tablet 2   No current facility-administered medications for this visit.     Musculoskeletal: Strength & Muscle Tone: within normal limits Gait & Station: normal Patient leans: N/A  Psychiatric Specialty Exam: Review of Systems  Psychiatric/Behavioral:  Positive for decreased concentration. Negative for dysphoric mood, hallucinations, self-injury, sleep disturbance and suicidal ideas. The patient is nervous/anxious. The patient is not hyperactive.     Blood pressure 131/73, pulse 91, temperature (!) 97.5 F (36.4 C), temperature source Oral, height 5\' 8"  (1.727 m), weight 162 lb 8 oz (73.7 kg), SpO2 99 %.Body mass index is 24.71 kg/m.  General Appearance: Casual  Eye Contact:  Good  Speech:  Clear and Coherent and Normal Rate  Volume:  Normal  Mood:  Anxious and Depressed  Affect:  Appropriate  Thought Process:  Coherent, Goal Directed, and Descriptions of Associations: Intact  Orientation:  Full (Time, Place, and Person)  Thought Content: WDL   Suicidal Thoughts:  No  Homicidal Thoughts:  No   Memory:  Immediate;   Good Recent;   Good Remote;   Good  Judgement:  Good  Insight:  Good  Psychomotor Activity:  Normal  Concentration:  Concentration: Good and Attention Span: Good  Recall:  Good  Fund of Knowledge: Good  Language: Good  Akathisia:  No  Handed:  Right  AIMS (if indicated): not done  Assets:  Communication Skills Desire for Improvement Housing Resilience Social Support Talents/Skills  Vocational/Educational  ADL's:  Intact  Cognition: WNL  Sleep:  Good   Screenings: GAD-7    Flowsheet Row Clinical Support from 08/19/2022 in [facility] Office Visit from 07/08/2022 in [facility] Video Visit from 05/03/2022 in [facility]  Total GAD-7 Score 4 11 11       PHQ2-9    Flowsheet Row Clinical Support from 08/19/2022 in [facility] Office Visit from 07/08/2022 in [facility] Video Visit from 05/03/2022 in [facility] at [facility] ED from 03/08/2022 in [facility] Office Visit from 04/23/2015 in [facility]  PHQ-2 Total Score 1 2 5 2  0  PHQ-9 Total Score -- 7 13 3  --      Flowsheet Row Clinical Support from 08/19/2022 in [facility] Office Visit from 07/08/2022 in [facility] ED from 03/08/2022 in [facility]  C-SSRS RISK CATEGORY Low Risk Low Risk Error: Q3, 4, or 5 should not be populated when Q2 is No        Assessment and Plan:   Patrick Alexander is a 37 year old, Caucasian male with a past psychiatric history significant for attention deficit hyperactivity disorder (combined type), alcohol use disorder (severe/dependence, in early remission), generalized anxiety disorder, chronic PTSD, and major depressive disorder who presents to [facility] for follow-up and medication management.  Patient reports that the use of naltrexone has been effective in managing his alcohol use disorder.  Patient states that when on naltrexone, he does not have the desire or urge to drink especially when in the situation.  Patient continues to endorse minimal depression and anxiety.  Patient reports that the use of sertraline has been effective in managing his depressive symptoms and anxiety.  Patient states that he is better able to regulate his emotions since being placed on sertraline.  Patient is interested in increasing the dosage of his Strattera for the management of his concentration.  Provider recommended increasing his Strattera from 60 mg to 80 mg daily for the management of his attention deficit hyperactivity disorder.  Patient was agreeable to recommendation.  Patient to continue taking all other medications as prescribed.  Patient's medications to be e-prescribed to pharmacy of choice.  Collaboration of Care: Collaboration of Care: Medication Management AEB provider managing patient's psychiatric medications and Psychiatrist AEB patient being followed by mental health provider at this facility  Patient/Guardian was advised Release of Information must be obtained prior to any record release in order to collaborate their care with an outside provider. Patient/Guardian was advised if they have not already done so to contact the registration department to sign all necessary forms in order for [place] to release information regarding their care.   Consent: Patient/Guardian gives verbal consent for treatment and assignment of benefits for services provided during this visit. Patient/Guardian expressed understanding and agreed to proceed.   1. Attention deficit hyperactivity disorder (ADHD), combined type  - atomoxetine (STRATTERA) 80 MG capsule; Take 1 capsule (80 mg total) by mouth daily.  Dispense: 30 capsule; Refill: 2 - cloNIDine  (CATAPRES) 0.1 MG tablet; Take 0.5-1 tablets (0.05-0.1 mg total) by mouth 3 (three) times daily as needed.Not to exceed 2 full tablets per day.  Dispense: 60 tablet; Refill: 2  2. Alcohol use disorder, severe, dependence (HCC)  - naltrexone (DEPADE) 50 MG tablet; Take 1 tablet (50  mg total) by mouth at bedtime.  Dispense: 30 tablet; Refill: 2  3. Generalized anxiety disorder  - sertraline (ZOLOFT) 100 MG tablet; Take 2 tablets (200 mg total) by mouth daily.  Dispense: 60 tablet; Refill: 2 - cloNIDine (CATAPRES) 0.1 MG tablet; Take 0.5-1 tablets (0.05-0.1 mg total) by mouth 3 (three) times daily as needed.Not to exceed 2 full tablets per day.  Dispense: 60 tablet; Refill: 2  4. Chronic post-traumatic stress disorder (PTSD)  - sertraline (ZOLOFT) 100 MG tablet; Take 2 tablets (200 mg total) by mouth daily.  Dispense: 60 tablet; Refill: 2  5. Moderate episode of recurrent major depressive disorder (HCC)  - sertraline (ZOLOFT) 100 MG tablet; Take 2 tablets (200 mg total) by mouth daily.  Dispense: 60 tablet; Refill: 2  Patient to follow-up in 2 months Provider spent a total of 26 minutes with the patient/reviewing patient's chart  Meta Hatchet, PA 08/22/2022, 9:22 PM

## 2022-08-20 ENCOUNTER — Other Ambulatory Visit: Payer: Self-pay

## 2022-08-22 ENCOUNTER — Encounter (HOSPITAL_COMMUNITY): Payer: Self-pay | Admitting: Physician Assistant

## 2022-09-01 ENCOUNTER — Other Ambulatory Visit: Payer: Self-pay

## 2022-09-17 ENCOUNTER — Other Ambulatory Visit: Payer: Self-pay

## 2022-09-23 ENCOUNTER — Other Ambulatory Visit: Payer: Self-pay

## 2022-09-30 ENCOUNTER — Other Ambulatory Visit: Payer: Self-pay

## 2022-10-01 ENCOUNTER — Other Ambulatory Visit: Payer: Self-pay

## 2022-10-15 ENCOUNTER — Other Ambulatory Visit: Payer: Self-pay

## 2022-10-19 ENCOUNTER — Ambulatory Visit (INDEPENDENT_AMBULATORY_CARE_PROVIDER_SITE_OTHER): Payer: No Payment, Other | Admitting: Physician Assistant

## 2022-10-19 ENCOUNTER — Other Ambulatory Visit: Payer: Self-pay

## 2022-10-19 ENCOUNTER — Encounter (HOSPITAL_COMMUNITY): Payer: Self-pay | Admitting: Physician Assistant

## 2022-10-19 DIAGNOSIS — F4312 Post-traumatic stress disorder, chronic: Secondary | ICD-10-CM

## 2022-10-19 DIAGNOSIS — F411 Generalized anxiety disorder: Secondary | ICD-10-CM | POA: Diagnosis not present

## 2022-10-19 DIAGNOSIS — F1020 Alcohol dependence, uncomplicated: Secondary | ICD-10-CM | POA: Diagnosis not present

## 2022-10-19 DIAGNOSIS — F5104 Psychophysiologic insomnia: Secondary | ICD-10-CM

## 2022-10-19 DIAGNOSIS — F902 Attention-deficit hyperactivity disorder, combined type: Secondary | ICD-10-CM

## 2022-10-19 DIAGNOSIS — F331 Major depressive disorder, recurrent, moderate: Secondary | ICD-10-CM

## 2022-10-19 MED ORDER — MELATONIN 3 MG PO TABS
3.0000 mg | ORAL_TABLET | Freq: Every evening | ORAL | 2 refills | Status: DC | PRN
Start: 2022-10-19 — End: 2022-12-21
  Filled 2022-10-19: qty 30, 30d supply, fill #0

## 2022-10-19 MED ORDER — ATOMOXETINE HCL 80 MG PO CAPS
80.0000 mg | ORAL_CAPSULE | Freq: Every day | ORAL | 2 refills | Status: DC
  Filled 2022-10-19 – 2022-11-12 (×2): qty 30, 30d supply, fill #0
  Filled 2022-12-09: qty 30, 30d supply, fill #1

## 2022-10-19 MED ORDER — CLONIDINE HCL 0.1 MG PO TABS
0.0500 mg | ORAL_TABLET | Freq: Three times a day (TID) | ORAL | 2 refills | Status: DC | PRN
Start: 2022-10-19 — End: 2022-12-21
  Filled 2022-10-19: qty 60, 30d supply, fill #0
  Filled 2022-11-26: qty 60, 30d supply, fill #1

## 2022-10-19 MED ORDER — SERTRALINE HCL 25 MG PO TABS
25.0000 mg | ORAL_TABLET | Freq: Every day | ORAL | 0 refills | Status: DC
Start: 2022-10-19 — End: 2023-01-05
  Filled 2022-10-19: qty 3, 3d supply, fill #0

## 2022-10-19 MED ORDER — NALTREXONE HCL 50 MG PO TABS
50.0000 mg | ORAL_TABLET | Freq: Every day | ORAL | 2 refills | Status: DC
  Filled 2022-10-19 – 2022-10-25 (×2): qty 30, 30d supply, fill #0
  Filled 2022-11-26: qty 30, 30d supply, fill #1

## 2022-10-19 MED ORDER — BUPROPION HCL ER (XL) 150 MG PO TB24
150.0000 mg | ORAL_TABLET | ORAL | 2 refills | Status: DC
  Filled 2022-10-19: qty 30, 30d supply, fill #0
  Filled 2022-11-16: qty 30, 30d supply, fill #1
  Filled 2022-12-15: qty 30, 30d supply, fill #2

## 2022-10-19 NOTE — Progress Notes (Signed)
BH MD/PA/NP OP Progress Note  10/19/2022 1:08 PM Patrick Alexander  MRN:  [id number]  Chief Complaint:  Chief Complaint  Patient presents with   Follow-up   Medication Management   HPI:   Patrick Alexander is a 37 year old, Caucasian male with a past psychiatric history significant for attention deficit hyperactivity disorder (combined type), alcohol use disorder (severe/dependence, in early remission), generalized anxiety disorder, chronic PTSD, and major depressive disorder who presents to [facility] for follow-up and medication management.  Patient is currently being managed on the following psychiatric medications:  Strattera 80 mg daily Naltrexone 50 mg daily Sertraline 200 mg daily Clonidine 0.1 mg (0.5 to 1 tablet) by mouth 3 times daily as needed  Patient presents to the encounter stating that his Patrick Alexander has been working well.  Patient reports that he has been stuttering more often and attributes his stuttering to his use of Zoloft.  He reports that he tried not taking his Strattera for a couple days to see if the stuttering would stop, but his stuttering continued.  He denies his stuttering is due to anxiety.  Despite the stuttering that he experiences, patient reports that his mood is elevated and consistent.  Patient is interested in replacing his use of Zoloft with another medication for the cessation of the stuttering.  Patient reports that he has been on Wellbutrin in the past and states that he took the medication while in high school.  Patient is also interested in utilizing Wellbutrin to help with smoking cessation.  A PHQ-9 screen was performed with the patient scoring a 4.  A GAD-7 screen was also performed with the patient scoring a 2.  Patient is alert and oriented x 4, pleasant, calm, cooperative, and fully engaged in conversation during the encounter. Patient endorses pleasant mood.  Patient denies suicidal or homicidal ideation.  He  further denies auditory or visual hallucinations and does not appear to be responding to internal/external stimuli.  Patient endorses good sleep and receives on average 7 to 8 hours of sleep each night.  Patient endorses good appetite and eats on average 2 meals per day.  Patient denies alcohol consumption and illicit drug use.  Patient endorses tobacco use and smokes on average 7 cigarettes/day.  Visit Diagnosis:    ICD-10-CM   1. Attention deficit hyperactivity disorder (ADHD), combined type  F90.2 buPROPion (WELLBUTRIN XL) 150 MG 24 hr tablet    atomoxetine (STRATTERA) 80 MG capsule    cloNIDine (CATAPRES) 0.1 MG tablet    2. Alcohol use disorder, severe, dependence (HCC)  F10.20 naltrexone (DEPADE) 50 MG tablet    3. Psychophysiological insomnia  F51.04 melatonin 3 MG TABS tablet    4. Generalized anxiety disorder  F41.1 sertraline (ZOLOFT) 25 MG tablet    cloNIDine (CATAPRES) 0.1 MG tablet    5. Chronic post-traumatic stress disorder (PTSD)  F43.12 sertraline (ZOLOFT) 25 MG tablet    6. Moderate episode of recurrent major depressive disorder (HCC)  F33.1 buPROPion (WELLBUTRIN XL) 150 MG 24 hr tablet    sertraline (ZOLOFT) 25 MG tablet      Past Psychiatric History:  Patient has a past Patrick history significant for attention deficit hyperactivity disorder, major depressive disorder, PTSD, polysubstance abuse, and generalized anxiety disorder   Patient reports that he has a past history of hospitalization due to mental health.  Patient reports that the hospitalization occurred around the time he was 54 when he had moved to [place].  Patient reports that he is  originally from [place] town and once he moved to [place], he wanted to leave and by experimenting with drugs.  Patient states that he grew up sheltered and once he moved to [place], he states that he rebelled against the environment he grew up in.   Patient denies a past history of suicide attempt but states that he has  had suicidal thoughts in the past   Patient denies a past history of homicide  Past Medical History:  Past Medical History:  Diagnosis Date   ADD (attention deficit disorder)    Hemorrhoids 02/03/2015   High risk sexual behavior 02/03/2015   History of admission to inpatient psychiatry department 03/09/2022   History of non-suicidal self-harm 03/09/2022   Sleep disorder 06/02/2015   Tobacco use disorder 06/02/2015    Past Surgical History:  Procedure Laterality Date   TONSILLECTOMY     TYMPANOSTOMY TUBE PLACEMENT     WISDOM TOOTH EXTRACTION      Family Psychiatric History:  Patient reports that mental health runs on his father's side of the family.  Patient reports that PTSD and generalized anxiety are common on his father's side of the family   Father - Generalized anxiety disorder and major depressive disorder Grandfather (paternal) - Generalized anxiety disorder and major depressive disorder.  Patient reports that his grandfather had a past psychiatric history of paranoid schizophrenia and spent extended periods of time in the hospital   Family history of suicide attempt: Patient denies Family history of homicide: Patient denies Family history of substance abuse: Patient reports that his cousins on his mother's side of the family all struggle with substance abuse  Family History:  Family History  Problem Relation Age of Onset   Allergies Mother    Hyperlipidemia Mother    Diabetes Mother    Cancer Mother        endometrial   Allergies Father    Hypertension Father    Stroke Father    Depression Father    Hyperlipidemia Father    Allergies Sister    Hyperlipidemia Sister     Social History:  Social History   Socioeconomic History   Marital status: Single    Spouse name: Not on file   Number of children: Not on file   Years of education: Not on file   Highest education level: Not on file  Occupational History   Not on file  Tobacco Use   Smoking status:  Every Day    Packs/day: .5    Types: Cigarettes    Last attempt to quit: 01/30/2015    Years since quitting: 7.7   Smokeless tobacco: Not on file  Substance and Sexual Activity   Alcohol use: Yes    Alcohol/week: 0.0 standard drinks of alcohol    Comment: social   Drug use: No   Sexual activity: Yes  Other Topics Concern   Not on file  Social History Narrative   Not on file   Social Determinants of Health   Financial Resource Strain: Not on file  Food Insecurity: Not on file  Transportation Needs: Not on file  Physical Activity: Not on file  Stress: Not on file  Social Connections: Not on file    Allergies:  Allergies  Allergen Reactions   Amoxicillin Nausea And Vomiting   Penicillins Nausea And Vomiting    Metabolic Disorder Labs: Lab Results  Component Value Date   HGBA1C 5.3 03/09/2022   MPG 105.41 03/09/2022   No results found for: "PROLACTIN" Lab  Results  Component Value Date   CHOL 192 03/09/2022   TRIG 104 03/09/2022   HDL 73 03/09/2022   CHOLHDL 2.6 03/09/2022   VLDL 21 03/09/2022   LDLCALC 98 03/09/2022   LDLCALC 120 04/23/2015   Lab Results  Component Value Date   TSH 2.539 03/09/2022    Therapeutic Level Labs: No results found for: "LITHIUM" No results found for: "VALPROATE" No results found for: "CBMZ"  Current Medications: Current Outpatient Medications  Medication Sig Dispense Refill   buPROPion (WELLBUTRIN XL) 150 MG 24 hr tablet Take 1 tablet (150 mg total) by mouth every morning. 30 tablet 2   atomoxetine (STRATTERA) 80 MG capsule Take 1 capsule (80 mg total) by mouth daily. 30 capsule 2   cetirizine (ZYRTEC ALLERGY) 10 MG tablet Take 1 tablet (10 mg total) by mouth daily. 30 tablet 1   cloNIDine (CATAPRES) 0.1 MG tablet Take 0.5-1 tablets (0.05-0.1 mg total) by mouth 3 (three) times daily as needed.Not to exceed 2 full tablets per day. 60 tablet 2   hydrOXYzine (ATARAX) 25 MG tablet Take 1-2 tablets (25-50 mg total) by mouth 3  (three) times daily as needed. 90 tablet 1   melatonin 3 MG TABS tablet Take 1 tablet (3 mg total) by mouth at bedtime as needed. 30 tablet 2   naltrexone (DEPADE) 50 MG tablet Take 1 tablet (50 mg total) by mouth at bedtime. 30 tablet 2   sertraline (ZOLOFT) 25 MG tablet Take 1 tablet (25 mg total) by mouth daily for 3 days before stopping Zoloft. 3 tablet 0   No current facility-administered medications for this visit.     Musculoskeletal: Strength & Muscle Tone: within normal limits Gait & Station: normal Patient leans: N/A  Psychiatric Specialty Exam: Review of Systems  Psychiatric/Behavioral:  Positive for decreased concentration. Negative for dysphoric mood, hallucinations, self-injury, sleep disturbance and suicidal ideas. The patient is nervous/anxious. The patient is not hyperactive.     There were no vitals taken for this visit.There is no height or weight on file to calculate BMI.  General Appearance: Casual  Eye Contact:  Good  Speech:  Clear and Coherent and Normal Rate  Volume:  Normal  Mood:  Anxious and Depressed  Affect:  Appropriate  Thought Process:  Coherent, Goal Directed, and Descriptions of Associations: Intact  Orientation:  Full (Time, Place, and Person)  Thought Content: WDL   Suicidal Thoughts:  No  Homicidal Thoughts:  No  Memory:  Immediate;   Good Recent;   Good Remote;   Good  Judgement:  Good  Insight:  Good  Psychomotor Activity:  Normal  Concentration:  Concentration: Good and Attention Span: Good  Recall:  Good  Fund of Knowledge: Good  Language: Good  Akathisia:  No  Handed:  Right  AIMS (if indicated): not done  Assets:  Communication Skills Desire for Improvement Housing Resilience Social Support Talents/Skills Vocational/Educational  ADL's:  Intact  Cognition: WNL  Sleep:  Good   Screenings: GAD-7    Flowsheet Row Clinical Support from 10/19/2022 in [facility] Clinical Support from 08/19/2022 in  [facility] Office Visit from 07/08/2022 in [facility] Video Visit from 05/03/2022 in [facility] at [facility]  Total GAD-7 Score 2 4 11 11       PHQ2-9    Flowsheet Row Clinical Support from 10/19/2022 in [facility] Clinical Support from 08/19/2022 in [facility]  Office Visit from 07/08/2022 in [facility] Video Visit from 05/03/2022 in [facility] at [facility] ED from 03/08/2022 in [facility]  PHQ-2 Total Score 2 1 2 5 2   PHQ-9 Total Score 4 -- 7 13 3       Flowsheet Row Clinical Support from 10/19/2022 in [facility] Clinical Support from 08/19/2022 in [facility] Office Visit from 07/08/2022 in [facility]  C-SSRS RISK CATEGORY Low Risk Low Risk Low Risk        Assessment and Plan:   Emmette Krog is a 37 year old, Caucasian male with a past psychiatric history significant for attention deficit hyperactivity disorder (combined type), alcohol use disorder (severe/dependence, in early remission), generalized anxiety disorder, chronic PTSD, and major depressive disorder who presents to [facility] for follow-up and medication management.  Patient presents to the encounter reporting that he has been experiencing stuttering from the use of his Zoloft.  Patient is interested in substituting his use of Zoloft with another medication for the cessation of his stuttering.  Patient reports that he has been on Wellbutrin in the past and is interested in being placed on the medication again.  Patient to be placed on Wellbutrin XL 150 mg 24-hour tablet daily for the management of his depressive symptoms and smoking cessation.  Patient to take sertraline 100 mg  for 3 days, followed by 50 mg for 3 more days, followed by 25 mg for 3 days before discontinuing.  The patient vocalized understanding.  Patient's medications to be e-prescribed to pharmacy of choice.  Collaboration of Care: Collaboration of Care: Medication Management AEB provider managing patient's psychiatric medications and Psychiatrist AEB patient being followed by mental health provider at this facility  Patient/Guardian was advised Release of Information must be obtained prior to any record release in order to collaborate their care with an outside provider. Patient/Guardian was advised if they have not already done so to contact the registration department to sign all necessary forms in order for [place] to release information regarding their care.   Consent: Patient/Guardian gives verbal consent for treatment and assignment of benefits for services provided during this visit. Patient/Guardian expressed understanding and agreed to proceed.   1. Attention deficit hyperactivity disorder (ADHD), combined type  - buPROPion (WELLBUTRIN XL) 150 MG 24 hr tablet; Take 1 tablet (150 mg total) by mouth every morning.  Dispense: 30 tablet; Refill: 2 - atomoxetine (STRATTERA) 80 MG capsule; Take 1 capsule (80 mg total) by mouth daily.  Dispense: 30 capsule; Refill: 2 - cloNIDine (CATAPRES) 0.1 MG tablet; Take 0.5-1 tablets (0.05-0.1 mg total) by mouth 3 (three) times daily as needed.Not to exceed 2 full tablets per day.  Dispense: 60 tablet; Refill: 2  2. Alcohol use disorder, severe, dependence (HCC)  - naltrexone (DEPADE) 50 MG tablet; Take 1 tablet (50 mg total) by mouth at bedtime.  Dispense: 30 tablet; Refill: 2  3. Psychophysiological insomnia  - melatonin 3 MG TABS tablet; Take 1 tablet (3 mg total) by mouth at bedtime as needed.  Dispense: 30 tablet; Refill: 2  4. Generalized anxiety disorder  - sertraline (ZOLOFT) 25 MG tablet; Take 1 tablet (25 mg total) by mouth daily for 3 days before  stopping Zoloft.  Dispense: 3 tablet; Refill: 0 - cloNIDine (CATAPRES) 0.1 MG tablet; Take 0.5-1 tablets (0.05-0.1 mg total) by mouth 3 (three) times daily as needed.Not to exceed 2  full tablets per day.  Dispense: 60 tablet; Refill: 2  5. Chronic post-traumatic stress disorder (PTSD)  - sertraline (ZOLOFT) 25 MG tablet; Take 1 tablet (25 mg total) by mouth daily for 3 days before stopping Zoloft.  Dispense: 3 tablet; Refill: 0  6. Moderate episode of recurrent major depressive disorder (HCC)  - buPROPion (WELLBUTRIN XL) 150 MG 24 hr tablet; Take 1 tablet (150 mg total) by mouth every morning.  Dispense: 30 tablet; Refill: 2 - sertraline (ZOLOFT) 25 MG tablet; Take 1 tablet (25 mg total) by mouth daily for 3 days before stopping Zoloft.  Dispense: 3 tablet; Refill: 0  Patient to follow-up in 2 months Provider spent a total of 27 minutes with the patient/reviewing patient's chart  Meta Hatchet, PA 10/19/2022, 1:08 PM

## 2022-10-25 ENCOUNTER — Other Ambulatory Visit: Payer: Self-pay

## 2022-11-12 ENCOUNTER — Other Ambulatory Visit: Payer: Self-pay

## 2022-11-16 ENCOUNTER — Other Ambulatory Visit: Payer: Self-pay

## 2022-11-26 ENCOUNTER — Other Ambulatory Visit: Payer: Self-pay

## 2022-12-10 ENCOUNTER — Other Ambulatory Visit: Payer: Self-pay

## 2022-12-16 ENCOUNTER — Other Ambulatory Visit: Payer: Self-pay

## 2022-12-21 ENCOUNTER — Ambulatory Visit (INDEPENDENT_AMBULATORY_CARE_PROVIDER_SITE_OTHER): Payer: MEDICAID | Admitting: Physician Assistant

## 2022-12-21 ENCOUNTER — Encounter (HOSPITAL_COMMUNITY): Payer: Self-pay | Admitting: Physician Assistant

## 2022-12-21 ENCOUNTER — Other Ambulatory Visit: Payer: Self-pay

## 2022-12-21 DIAGNOSIS — F5104 Psychophysiologic insomnia: Secondary | ICD-10-CM

## 2022-12-21 DIAGNOSIS — F1020 Alcohol dependence, uncomplicated: Secondary | ICD-10-CM | POA: Diagnosis not present

## 2022-12-21 DIAGNOSIS — F902 Attention-deficit hyperactivity disorder, combined type: Secondary | ICD-10-CM

## 2022-12-21 DIAGNOSIS — F411 Generalized anxiety disorder: Secondary | ICD-10-CM | POA: Diagnosis not present

## 2022-12-21 DIAGNOSIS — F331 Major depressive disorder, recurrent, moderate: Secondary | ICD-10-CM

## 2022-12-21 MED ORDER — ATOMOXETINE HCL 40 MG PO CAPS
40.0000 mg | ORAL_CAPSULE | Freq: Every day | ORAL | 2 refills | Status: DC
Start: 2022-12-21 — End: 2023-02-23
  Filled 2022-12-21: qty 30, 30d supply, fill #0
  Filled 2023-01-16: qty 30, 30d supply, fill #1
  Filled 2023-02-14: qty 30, 30d supply, fill #2

## 2022-12-21 MED ORDER — MELATONIN 10 MG PO TABS
10.0000 mg | ORAL_TABLET | Freq: Every evening | ORAL | 2 refills | Status: DC | PRN
Start: 2022-12-21 — End: 2023-02-23
  Filled 2022-12-21: qty 30, fill #0

## 2022-12-21 MED ORDER — CLONIDINE HCL 0.1 MG PO TABS
0.0500 mg | ORAL_TABLET | Freq: Three times a day (TID) | ORAL | 2 refills | Status: DC | PRN
Start: 2022-12-21 — End: 2023-02-23
  Filled 2022-12-21: qty 60, 30d supply, fill #0
  Filled 2023-01-24: qty 60, 30d supply, fill #1
  Filled 2023-02-23 (×2): qty 60, 30d supply, fill #2

## 2022-12-21 MED ORDER — NALTREXONE HCL 50 MG PO TABS
50.0000 mg | ORAL_TABLET | Freq: Every day | ORAL | 2 refills | Status: DC
Start: 2022-12-21 — End: 2023-02-23
  Filled 2022-12-21: qty 30, 30d supply, fill #0
  Filled 2023-01-24: qty 30, 30d supply, fill #1
  Filled 2023-02-23 (×2): qty 30, 30d supply, fill #2

## 2022-12-21 MED ORDER — BUPROPION HCL ER (XL) 300 MG PO TB24
300.0000 mg | ORAL_TABLET | ORAL | 2 refills | Status: DC
Start: 2022-12-21 — End: 2023-02-23
  Filled 2022-12-21: qty 30, 30d supply, fill #0
  Filled 2023-01-16: qty 30, 30d supply, fill #1
  Filled 2023-02-14: qty 30, 30d supply, fill #2

## 2022-12-21 NOTE — Progress Notes (Signed)
BH MD/PA/NP OP Progress Note  12/21/2022 2:40 PM Patrick Alexander  MRN:  [id number]  Chief Complaint:  Chief Complaint  Patient presents with   Follow-up   Medication Management   HPI:   Patrick Alexander is a 37 year old, Caucasian male with a past psychiatric history significant for attention deficit hyperactivity disorder (combined type), alcohol use disorder (severe/dependence, in early remission), generalized anxiety disorder, chronic PTSD, and major depressive disorder who presents to [facility] for follow-up and medication management.  Patient is currently being managed on the following psychiatric medications:  Strattera 80 mg daily Naltrexone 50 mg daily Bupropion (Wellbutrin XL) 150 mg 24-hour tablet daily Clonidine 0.1 mg (0.5 to 1 tablet) by mouth 3 times daily as needed Melatonin 3 mg at bedtime as needed  Patient reports that he started taking Wellbutrin, he felt pissed off in the mornings.  Patient states that he was so irritable that he felt like throwing furniture through a wall.  Patient states that he went to his pharmacy to determine why he was so irritable and his pharmacist stated that his irritability may be attributed to Wellbutrin acting on Strattera.  Patient states that he is not as agitated since adjusting the times in which he takes his medication.  Patient reports that he takes his Wellbutrin, clonidine, and allergy pill at 7 in the morning while taking his Strattera at 8:45 AM.  Since discontinuing his sertraline, patient reports that he has noticed some depressive spells.  Patient rates his depression at 3-4 out of 10 with 10 being most severe.  Patient states that on occasion, he may feel really depressed for 30 seconds but then he will go back to feeling better.  Patient endorses the following depressive symptoms: ruminating related to traumatic loss, overwhelming sadness, and lack of motivation.  Patient endorses some anxiety  stating that he experiences anxiety when around/talking to people.  Lastly, patient reports that there are instances where he completely forgets what he is talking about and loses his train of thought.  A GAD-7 screen was performed with the patient scoring a 7.  Patient is alert and oriented x 4, pleasant, calm, cooperative, and fully engaged in conversation during the encounter.  Patient describes his mood as thankful.  Patient denies suicidal or homicidal ideations.  He further denied auditory or visual hallucinations and does not appear to be responding to internal/external stimuli.  Patient received on average 7 to 8 hours of sleep per night but states that his CPAP machine occasionally wakes him up at night.  Patient endorses good appetite and eats on average 4-5 small meals per day.  Patient denies alcohol consumption or illicit drug use.  Patient endorses tobacco use and smoking of 3 cigarettes/day.  Visit Diagnosis:    ICD-10-CM   1. Attention deficit hyperactivity disorder (ADHD), combined type  F90.2 buPROPion (WELLBUTRIN XL) 300 MG 24 hr tablet    cloNIDine (CATAPRES) 0.1 MG tablet    atomoxetine (STRATTERA) 40 MG capsule    2. Moderate episode of recurrent major depressive disorder (HCC)  F33.1 buPROPion (WELLBUTRIN XL) 300 MG 24 hr tablet    3. Generalized anxiety disorder  F41.1 cloNIDine (CATAPRES) 0.1 MG tablet    4. Alcohol use disorder, severe, dependence (HCC)  F10.20 naltrexone (DEPADE) 50 MG tablet    5. Psychophysiological insomnia  F51.04 Melatonin 10 MG TABS      Past Psychiatric History:  Patient has a past Patrick Alexander history significant for attention deficit hyperactivity disorder,  major depressive disorder, PTSD, polysubstance abuse, and generalized anxiety disorder   Patient reports that he has a past history of hospitalization due to mental health.  Patient reports that the hospitalization occurred around the time he was 45 when he had moved to [place].  Patient  reports that he is originally from a [address] town and once he moved to [place], he wanted to leave and by experimenting with drugs.  Patient states that he grew up sheltered and once he moved to [place], he states that he rebelled against the environment he grew up in.   Patient denies a past history of suicide attempt but states that he has had suicidal thoughts in the past   Patient denies a past history of homicide  Past Medical History:  Past Medical History:  Diagnosis Date   ADD (attention deficit disorder)    Hemorrhoids 02/03/2015   High risk sexual behavior 02/03/2015   History of admission to inpatient psychiatry department 03/09/2022   History of non-suicidal self-harm 03/09/2022   Sleep disorder 06/02/2015   Tobacco use disorder 06/02/2015    Past Surgical History:  Procedure Laterality Date   TONSILLECTOMY     TYMPANOSTOMY TUBE PLACEMENT     WISDOM TOOTH EXTRACTION      Family Psychiatric History:  Patient reports that mental health runs on his father's side of the family.  Patient reports that PTSD and generalized anxiety are common on his father's side of the family   Father - Generalized anxiety disorder and major depressive disorder Grandfather (paternal) - Generalized anxiety disorder and major depressive disorder.  Patient reports that his grandfather had a past psychiatric history of paranoid schizophrenia and spent extended periods of time in the hospital   Family history of suicide attempt: Patient denies Family history of homicide: Patient denies Family history of substance abuse: Patient reports that his cousins on his mother's side of the family all struggle with substance abuse  Family History:  Family History  Problem Relation Age of Onset   Allergies Mother    Hyperlipidemia Mother    Diabetes Mother    Cancer Mother        endometrial   Allergies Father    Hypertension Father    Stroke Father    Depression Father    Hyperlipidemia Father     Allergies Sister    Hyperlipidemia Sister     Social History:  Social History   Socioeconomic History   Marital status: Single    Spouse name: Not on file   Number of children: Not on file   Years of education: Not on file   Highest education level: Not on file  Occupational History   Not on file  Tobacco Use   Smoking status: Every Day    Current packs/day: 0.00    Types: Cigarettes    Last attempt to quit: 01/30/2015    Years since quitting: 7.8   Smokeless tobacco: Not on file  Substance and Sexual Activity   Alcohol use: Yes    Alcohol/week: 0.0 standard drinks of alcohol    Comment: social   Drug use: No   Sexual activity: Yes  Other Topics Concern   Not on file  Social History Narrative   Not on file   Social Determinants of Health   Financial Resource Strain: Not on file  Food Insecurity: Not on file  Transportation Needs: Not on file  Physical Activity: Not on file  Stress: Not on file  Social Connections: Not on  file    Allergies:  Allergies  Allergen Reactions   Amoxicillin Nausea And Vomiting   Penicillins Nausea And Vomiting    Metabolic Disorder Labs: Lab Results  Component Value Date   HGBA1C 5.3 03/09/2022   MPG 105.41 03/09/2022   No results found for: "PROLACTIN" Lab Results  Component Value Date   CHOL 192 03/09/2022   TRIG 104 03/09/2022   HDL 73 03/09/2022   CHOLHDL 2.6 03/09/2022   VLDL 21 03/09/2022   LDLCALC 98 03/09/2022   LDLCALC 120 04/23/2015   Lab Results  Component Value Date   TSH 2.539 03/09/2022    Therapeutic Level Labs: No results found for: "LITHIUM" No results found for: "VALPROATE" No results found for: "CBMZ"  Current Medications: Current Outpatient Medications  Medication Sig Dispense Refill   atomoxetine (STRATTERA) 40 MG capsule Take 1 capsule (40 mg total) by mouth daily. 30 capsule 2   buPROPion (WELLBUTRIN XL) 300 MG 24 hr tablet Take 1 tablet (300 mg total) by mouth every morning. 30  tablet 2   cetirizine (ZYRTEC ALLERGY) 10 MG tablet Take 1 tablet (10 mg total) by mouth daily. 30 tablet 1   cloNIDine (CATAPRES) 0.1 MG tablet Take 0.5-1 tablets (0.05-0.1 mg total) by mouth 3 (three) times daily as needed.Not to exceed 2 full tablets per day. 60 tablet 2   hydrOXYzine (ATARAX) 25 MG tablet Take 1-2 tablets (25-50 mg total) by mouth 3 (three) times daily as needed. 90 tablet 1   Melatonin 10 MG TABS Take 10 mg by mouth at bedtime as needed. 30 tablet 2   naltrexone (DEPADE) 50 MG tablet Take 1 tablet (50 mg total) by mouth at bedtime. 30 tablet 2   sertraline (ZOLOFT) 25 MG tablet Take 1 tablet (25 mg total) by mouth daily for 3 days before stopping Zoloft. 3 tablet 0   No current facility-administered medications for this visit.     Musculoskeletal: Strength & Muscle Tone: within normal limits Gait & Station: normal Patient leans: N/A  Psychiatric Specialty Exam: Review of Systems  Psychiatric/Behavioral:  Negative for decreased concentration, dysphoric mood, hallucinations, self-injury, sleep disturbance and suicidal ideas. The patient is nervous/anxious. The patient is not hyperactive.     Blood pressure 125/83, pulse 87, height 5\' 8"  (1.727 m), weight 151 lb (68.5 kg), SpO2 100%.Body mass index is 22.96 kg/m.  General Appearance: Casual  Eye Contact:  Good  Speech:  Clear and Coherent and Normal Rate  Volume:  Normal  Mood:  Anxious and Depressed  Affect:  Appropriate  Thought Process:  Coherent, Goal Directed, and Descriptions of Associations: Intact  Orientation:  Full (Time, Place, and Person)  Thought Content: WDL   Suicidal Thoughts:  No  Homicidal Thoughts:  No  Memory:  Immediate;   Good Recent;   Good Remote;   Good  Judgement:  Good  Insight:  Good  Psychomotor Activity:  Normal  Concentration:  Concentration: Good and Attention Span: Good  Recall:  Good  Fund of Knowledge: Good  Language: Good  Akathisia:  No  Handed:  Right  AIMS (if  indicated): not done  Assets:  Communication Skills Desire for Improvement Housing Resilience Social Support Talents/Skills Vocational/Educational  ADL's:  Intact  Cognition: WNL  Sleep:  Good   Screenings: GAD-7    Flowsheet Row Clinical Support from 12/21/2022 in [facility] Clinical Support from 10/19/2022 in [facility] Clinical Support from 08/19/2022 in [facility]  Visit from 07/08/2022 in [facility] Video Visit from 05/03/2022 in [facility]  Total GAD-7 Score 7 2 4 11 11       PHQ2-9    Flowsheet Row Clinical Support from 12/21/2022 in [facility] Clinical Support from 10/19/2022 in [facility] Clinical Support from 08/19/2022 in [facility] Office Visit from 07/08/2022 in [facility] Video Visit from 05/03/2022 in [facility] at [facility] Total Score 1 2 1 2 5   PHQ-9 Total Score -- 4 -- 7 13      Flowsheet Row Clinical Support from 12/21/2022 in [facility] Clinical Support from 10/19/2022 in [facility] Clinical Support from 08/19/2022 in [facility]  C-SSRS RISK CATEGORY Low Risk Low Risk Low Risk        Assessment and Plan:   Patrick Alexander is a 37 year old, Caucasian male with a past psychiatric history significant for attention deficit hyperactivity disorder (combined type), alcohol use disorder (severe/dependence, in early remission), generalized anxiety disorder, chronic PTSD, and major depressive disorder who presents to [facility] for follow-up and medication management.  Patient presents to the encounter stating that when he first started  Wellbutrin, he experienced irritability/agitation.  After talking with a pharmacist, patient was informed that his irritability may be attributed to Wellbutrin's effect on Strattera.  After talking with the pharmacist, patient ended up adjusting when he takes his medications.  Patient reports that he now takes Wellbutrin, clonidine, and his allergy pill at 7 in the morning while taking his Strattera at 8:45 AM.  Since changing the times in which he takes his medications, patient reports that he has not been experiencing as much irritability.  Patient continues to endorse some depression stating that there would be a small moments of time where he feels really depressed, but then he feels better.  Patient continues to endorse anxiety attributed to being around and talking to people.  Provider recommended increasing patient's Wellbutrin from 150 to 300 mg daily for the management of his depressive symptoms.  Patient was agreeable to recommendation.  Higher to the conclusion of the encounter, patient requested to decrease his doses of Strattera from 80 mg to 40 mg daily due to the medication being too strong.  Provider to adjust patient's medication accordingly.  Patient's medications to be e-prescribed through pharmacy of choice.  Collaboration of Care: Collaboration of Care: Medication Management AEB provider managing patient's psychiatric medications and Psychiatrist AEB patient being followed by mental health provider at this facility  Patient/Guardian was advised Release of Information must be obtained prior to any record release in order to collaborate their care with an outside provider. Patient/Guardian was advised if they have not already done so to contact the registration department to sign all necessary forms in order for [place] to release information regarding their care.   Consent: Patient/Guardian gives verbal consent for treatment and assignment of benefits for services provided during this visit.  Patient/Guardian expressed understanding and agreed to proceed.   1. Attention deficit hyperactivity disorder (ADHD), combined type  - buPROPion (WELLBUTRIN XL) 300 MG 24 hr tablet; Take 1 tablet (300 mg total) by mouth every morning.  Dispense: 30 tablet; Refill: 2 - cloNIDine (CATAPRES) 0.1 MG tablet; Take 0.5-1 tablets (0.05-0.1 mg total) by mouth 3 (three) times daily as needed.Not to exceed  2 full tablets per day.  Dispense: 60 tablet; Refill: 2 - atomoxetine (STRATTERA) 40 MG capsule; Take 1 capsule (40 mg total) by mouth daily.  Dispense: 30 capsule; Refill: 2  2. Moderate episode of recurrent major depressive disorder (HCC)  - buPROPion (WELLBUTRIN XL) 300 MG 24 hr tablet; Take 1 tablet (300 mg total) by mouth every morning.  Dispense: 30 tablet; Refill: 2  3. Generalized anxiety disorder  - cloNIDine (CATAPRES) 0.1 MG tablet; Take 0.5-1 tablets (0.05-0.1 mg total) by mouth 3 (three) times daily as needed.Not to exceed 2 full tablets per day.  Dispense: 60 tablet; Refill: 2  4. Alcohol use disorder, severe, dependence (HCC)  - naltrexone (DEPADE) 50 MG tablet; Take 1 tablet (50 mg total) by mouth at bedtime.  Dispense: 30 tablet; Refill: 2  5. Psychophysiological insomnia  - Melatonin 10 MG TABS; Take 10 mg by mouth at bedtime as needed.  Dispense: 30 tablet; Refill: 2  Patient to follow-up in 2 months Provider spent a total of 19 minutes with the patient/reviewing patient's chart  Meta Hatchet, PA 12/21/2022, 2:40 PM

## 2022-12-31 ENCOUNTER — Encounter: Payer: Self-pay | Admitting: Family Medicine

## 2023-01-05 ENCOUNTER — Ambulatory Visit: Payer: MEDICAID | Admitting: Family Medicine

## 2023-01-05 ENCOUNTER — Encounter: Payer: Self-pay | Admitting: Family Medicine

## 2023-01-05 VITALS — BP 134/78 | HR 98 | Temp 98.5°F | Ht 67.25 in | Wt 152.2 lb

## 2023-01-05 DIAGNOSIS — F17200 Nicotine dependence, unspecified, uncomplicated: Secondary | ICD-10-CM

## 2023-01-05 DIAGNOSIS — F902 Attention-deficit hyperactivity disorder, combined type: Secondary | ICD-10-CM | POA: Diagnosis not present

## 2023-01-05 DIAGNOSIS — Z125 Encounter for screening for malignant neoplasm of prostate: Secondary | ICD-10-CM

## 2023-01-05 DIAGNOSIS — F1020 Alcohol dependence, uncomplicated: Secondary | ICD-10-CM | POA: Diagnosis not present

## 2023-01-05 DIAGNOSIS — G4733 Obstructive sleep apnea (adult) (pediatric): Secondary | ICD-10-CM | POA: Diagnosis not present

## 2023-01-05 DIAGNOSIS — F322 Major depressive disorder, single episode, severe without psychotic features: Secondary | ICD-10-CM

## 2023-01-05 DIAGNOSIS — Z23 Encounter for immunization: Secondary | ICD-10-CM | POA: Diagnosis not present

## 2023-01-05 LAB — PSA: PSA: 0.52 ng/mL (ref 0.10–4.00)

## 2023-01-05 NOTE — Assessment & Plan Note (Signed)
Advise smoking cessation. Patient not currently ready to change.

## 2023-01-05 NOTE — Assessment & Plan Note (Signed)
Prior diagnosis of sleep apnea. He plans to see a dentist regarding an oral device for this. Recommend he look into [facility] as an option.

## 2023-01-05 NOTE — Progress Notes (Signed)
[facility] [address] [place] [address] Dept: [phone] Dept Fax: [phone]  New Patient Office Visit  Subjective:    Patient ID: Patrick Alexander, male    DOB: 1985/07/22, 37 y.o..   MRN: [id number]  Chief Complaint  Patient presents with   Establish Care    NP establish care.    History of Present Illness: Patient is in today to establish care. Patrick Alexander was born in [place]. He grew up mostly in [place], [place]. He attended [employer] for a semester in dance, but eventually left college. 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 He is currently single and has no children. Patrick Alexander smokes 3-5 cig a day. he admits that this has been a crutch for him and becomes a convenient excuse for him to step out of stressful situations. He notes he has not had any alcohol since Nov. 2023. He denies any current drug use, though has had past issues with marijuana, prescription opioids, cocaine, Ecstasy, and Adderall.  Patrick Alexander went through inpatient alcohol treatment last Nov. He is currently maintaining his sobriety. He is engaged in regular counseling. He is managed on naltrexone 50 mg daily.  Patrick Alexander has a history of chronic depression. He is managed on bupropion XL 300 mg daily. He also take clonidine 0.1 mg 1/2 tab each morning, 1/2 tab each afternoon as needed, and 1 tab at night.  Patrick Alexander has a history of ADHD. He is managed on atomoxetine 40 mg daily.  Patrick Alexander expresses some concerns about prostate cancer. He notes he has a family member that died of prostate cancer. He denies any nocturia. He occasionally strains to start his stream, can have symptoms of poor bladder emptying, and occasional urinary dribbling.  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   Seasonal allergic rhinitis due to pollen 08/19/2015   Positive ANA (antinuclear antibody) 08/19/2015   OSA on CPAP 08/19/2015   Tobacco use disorder 06/02/2015   Obsessive-compulsive disorder, unspecified 08/05/2012   Past Surgical History:  Procedure Laterality Date   TONSILLECTOMY     TYMPANOSTOMY TUBE PLACEMENT     WISDOM TOOTH EXTRACTION     Family History  Problem Relation Age of Onset   Allergies Mother    Hyperlipidemia Mother    Diabetes Mother    Cancer Mother        Endometrial   Arthritis Mother    Varicose Veins Mother    Allergies Father    Hypertension Father    Stroke Father    Depression Father    Hyperlipidemia Father    Anxiety disorder Father    Arthritis Father    Cancer Father        Skin, lymphoma   Heart disease Father    Vision loss Father    Allergies Sister    Hyperlipidemia Sister  ADD / ADHD Sister    Varicose Veins Sister    Alcohol abuse Maternal Aunt    Cancer Maternal Aunt        Skin   Miscarriages / Stillbirths Maternal Aunt    Arthritis Maternal Grandmother    Cancer Maternal Grandmother        Glioblastoma   Cancer Maternal Grandfather        Prostate   Cancer Paternal Grandmother        Breast   Dementia Paternal Grandmother    Anxiety disorder Paternal Grandfather    Depression Paternal Grandfather    Heart disease Paternal Grandfather    Hypertension Paternal Grandfather     Schizophrenia Paternal Grandfather    Outpatient Medications Prior to Visit  Medication Sig Dispense Refill   acetaminophen (TYLENOL) 325 MG tablet Take 650 mg by mouth every 6 (six) hours as needed.     atomoxetine (STRATTERA) 40 MG capsule Take 1 capsule (40 mg total) by mouth daily. 30 capsule 2   buPROPion (WELLBUTRIN XL) 300 MG 24 hr tablet Take 1 tablet (300 mg total) by mouth every morning. 30 tablet 2   cetirizine (ZYRTEC ALLERGY) 10 MG tablet Take 1 tablet (10 mg total) by mouth daily. 30 tablet 1   cloNIDine (CATAPRES) 0.1 MG tablet Take 0.5-1 tablets (0.05-0.1 mg total) by mouth 3 (three) times daily as needed.Not to exceed 2 full tablets per day. 60 tablet 2   loratadine (CLARITIN) 10 MG tablet Take 10 mg by mouth daily as needed for allergies.     Melatonin 10 MG TABS Take 10 mg by mouth at bedtime as needed. 30 tablet 2   naltrexone (DEPADE) 50 MG tablet Take 1 tablet (50 mg total) by mouth at bedtime. 30 tablet 2   hydrOXYzine (ATARAX) 25 MG tablet Take 1-2 tablets (25-50 mg total) by mouth 3 (three) times daily as needed. 90 tablet 1   sertraline (ZOLOFT) 25 MG tablet Take 1 tablet (25 mg total) by mouth daily for 3 days before stopping Zoloft. 3 tablet 0   No facility-administered medications prior to visit.   Allergies  Allergen Reactions   Amoxicillin Nausea And Vomiting   Penicillins Nausea And Vomiting   Objective:   Today's Vitals   01/05/23 0841  BP: 134/78  Pulse: 98  Temp: 98.5 F (36.9 C)  TempSrc: Temporal  SpO2: 99%  Weight: 152 lb 3.2 oz (69 kg)  Height: 5' 7.25" (1.708 m)   Body mass index is 23.66 kg/m.   General: Well developed, well nourished. No acute distress. Psych: Alert and oriented. Normal mood and affect.  There are no preventive care reminders to display for this patient.    Assessment & Plan:   Problem List Items Addressed This Visit       Respiratory   OSA on CPAP - Primary (Chronic)    Prior diagnosis of sleep apnea. He  plans to see a dentist regarding an oral device for this. Recommend he look into [facility] as an option.        Other   Alcohol use disorder, severe, dependence (HCC)    Maintaining sobriety. Continue counseling and naltrexone 50 mg daily.      Attention deficit hyperactivity disorder (ADHD)    Agree with current use of atomoxetine 40 mg daily and clonidine 0.1 mg- 1/2 tab q am and 1 tab q pm.      MDD (major depressive disorder), severe (HCC)    Stable. Continue counseling and  bupropion XL 300 mg daily.      Tobacco use disorder    Advise smoking cessation. Patient not currently ready to change.      Other Visit Diagnoses     Screening for prostate cancer       I will proceed with screening based on some LUTS and strong family history of cancers.   Relevant Orders   PSA   Need for immunization against influenza       Relevant Orders   Flu vaccine trivalent PF, 6mos and older(Flulaval,Afluria,Fluarix,Fluzone) (Completed)       Return for Annual preventative care.   Loyola Mast, MD

## 2023-01-05 NOTE — Assessment & Plan Note (Signed)
Stable. Continue counseling and bupropion XL 300 mg daily.

## 2023-01-05 NOTE — Assessment & Plan Note (Signed)
Maintaining sobriety. Continue counseling and naltrexone 50 mg daily.

## 2023-01-05 NOTE — Assessment & Plan Note (Signed)
Agree with current use of atomoxetine 40 mg daily and clonidine 0.1 mg- 1/2 tab q am and 1 tab q pm.

## 2023-01-06 ENCOUNTER — Other Ambulatory Visit: Payer: Self-pay

## 2023-01-06 MED ORDER — COVID-19 MRNA VAC-TRIS(PFIZER) 30 MCG/0.3ML IM SUSY
0.3000 mL | PREFILLED_SYRINGE | Freq: Once | INTRAMUSCULAR | 0 refills | Status: AC
  Filled 2023-01-06: qty 0.3, 1d supply, fill #0

## 2023-01-26 ENCOUNTER — Other Ambulatory Visit: Payer: Self-pay

## 2023-02-07 ENCOUNTER — Encounter: Payer: Self-pay | Admitting: Family Medicine

## 2023-02-07 ENCOUNTER — Ambulatory Visit (INDEPENDENT_AMBULATORY_CARE_PROVIDER_SITE_OTHER): Payer: MEDICAID | Admitting: Family Medicine

## 2023-02-07 VITALS — BP 122/76 | HR 97 | Temp 98.5°F | Ht 67.25 in | Wt 149.4 lb

## 2023-02-07 DIAGNOSIS — F1021 Alcohol dependence, in remission: Secondary | ICD-10-CM

## 2023-02-07 DIAGNOSIS — F17200 Nicotine dependence, unspecified, uncomplicated: Secondary | ICD-10-CM

## 2023-02-07 DIAGNOSIS — Z8619 Personal history of other infectious and parasitic diseases: Secondary | ICD-10-CM | POA: Insufficient documentation

## 2023-02-07 DIAGNOSIS — Z113 Encounter for screening for infections with a predominantly sexual mode of transmission: Secondary | ICD-10-CM | POA: Diagnosis not present

## 2023-02-07 DIAGNOSIS — Z0000 Encounter for general adult medical examination without abnormal findings: Secondary | ICD-10-CM

## 2023-02-07 LAB — URINALYSIS, ROUTINE W REFLEX MICROSCOPIC
Bilirubin Urine: NEGATIVE
Hgb urine dipstick: NEGATIVE
Ketones, ur: NEGATIVE
Leukocytes,Ua: NEGATIVE
Nitrite: NEGATIVE
Specific Gravity, Urine: 1.02 (ref 1.000–1.030)
Urine Glucose: NEGATIVE
Urobilinogen, UA: 0.2 (ref 0.0–1.0)
pH: 7 (ref 5.0–8.0)

## 2023-02-07 LAB — COMPREHENSIVE METABOLIC PANEL
ALT: 11 U/L (ref 0–53)
AST: 15 U/L (ref 0–37)
Albumin: 4.8 g/dL (ref 3.5–5.2)
Alkaline Phosphatase: 51 U/L (ref 39–117)
BUN: 19 mg/dL (ref 6–23)
CO2: 29 meq/L (ref 19–32)
Calcium: 9.4 mg/dL (ref 8.4–10.5)
Chloride: 100 meq/L (ref 96–112)
Creatinine, Ser: 1.14 mg/dL (ref 0.40–1.50)
GFR: 83.45 mL/min (ref >60.00)
Glucose, Bld: 92 mg/dL (ref 70–99)
Potassium: 4.1 meq/L (ref 3.5–5.1)
Sodium: 139 meq/L (ref 135–145)
Total Bilirubin: 0.4 mg/dL (ref 0.2–1.2)
Total Protein: 6.7 g/dL (ref 6.0–8.3)

## 2023-02-07 NOTE — Assessment & Plan Note (Signed)
Currently smoking a few puffs of 1-2 cigarettes a day. Encourage Mr. Legarda to quit when he is ready.

## 2023-02-07 NOTE — Progress Notes (Signed)
[facility] PRIMARY [facility]ARE [facility] [address] [place] [address] Dept: [phone] Dept Fax: [phone]  Annual Physical Visit  Subjective:    Patient ID: Patrick Alexander, male    DOB: Jul 30, 1985, 37 y.o..   MRN: [id number]  Chief Complaint  Patient presents with   Annual Exam    CPE/labs.   Fasting today.  No concerns.    History of Present Illness:  Patient is in today for an annual physical/preventative visit.  Mr. Patrick Alexander has a history of alcohol dependence. He remains sober for the past 11 months. He is engaged in regular counseling. He is managed on naltrexone 50 mg daily.   Mr. Patrick Alexander has a history of chronic depression. He is managed on bupropion XL 300 mg daily. He also take clonidine 0.1 mg 1/2 tab each morning, 1/2 tab each afternoon as needed, and 1 tab at night.   Mr. Patrick Alexander has a history of ADHD. He is managed on atomoxetine 40 mg daily.  Review of Systems  Constitutional:  Negative for chills, diaphoresis, fever, malaise/fatigue and weight loss.  HENT:  Negative for congestion, ear pain, hearing loss, sinus pain, sore throat and tinnitus.   Eyes:  Positive for blurred vision. Negative for pain, discharge and redness.       Notes a history of an astigmatism in middle school. Has not seen an eye doctor in some time now.  Respiratory:  Negative for cough, shortness of breath and wheezing.   Cardiovascular:  Negative for chest pain and palpitations.  Gastrointestinal:  Positive for heartburn. Negative for abdominal pain, constipation, diarrhea, nausea and vomiting.       Has heartburn quite frequently. He doesn't feel Tums or drinking milk works very well. had previously been prescribed Prilosec, but not using currently.  Genitourinary:        Notes occasional milky discharge form penis. He was previously told this was a side effect of his bupropion and atomoxetine. No pain with urination.  Musculoskeletal:  Positive for joint  pain. Negative for back pain and myalgias.       Occasional bilateral knee popping.  Skin:  Negative for itching and rash.  Psychiatric/Behavioral:  Negative for depression. The patient is not nervous/anxious.    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   Seasonal allergic rhinitis due to pollen 08/19/2015   Positive ANA (antinuclear antibody) 08/19/2015   OSA on CPAP 08/19/2015   Tobacco use disorder 06/02/2015   Obsessive-compulsive disorder, unspecified 08/05/2012   Past Surgical History:  Procedure Laterality Date   TONSILLECTOMY     TYMPANOSTOMY TUBE PLACEMENT     WISDOM TOOTH EXTRACTION     Family History  Problem Relation Age of Onset   Allergies Mother    Hyperlipidemia Mother    Diabetes Mother    Cancer Mother        Endometrial   Arthritis Mother    Varicose Veins Mother    Allergies Father    Hypertension Father  Stroke Father    Depression Father    Hyperlipidemia Father    Anxiety disorder Father    Arthritis Father    Cancer Father        Skin, lymphoma   Heart disease Father    Vision loss Father    Allergies Sister    Hyperlipidemia Sister    ADD / ADHD Sister    Varicose Veins Sister    Alcohol abuse Maternal Aunt    Cancer Maternal Aunt        Skin   Miscarriages / Stillbirths Maternal Aunt    Arthritis Maternal Grandmother    Cancer Maternal  Grandmother        Glioblastoma   Cancer Maternal Grandfather        Prostate   Cancer Paternal Grandmother        Breast   Dementia Paternal Grandmother    Anxiety disorder Paternal Grandfather    Depression Paternal Grandfather    Heart disease Paternal Grandfather    Hypertension Paternal Grandfather    Schizophrenia Paternal Grandfather    Outpatient Medications Prior to Visit  Medication Sig Dispense Refill   acetaminophen (TYLENOL) 325 MG tablet Take 650 mg by mouth every 6 (six) hours as needed.     atomoxetine (STRATTERA) 40 MG capsule Take 1 capsule (40 mg total) by mouth daily. 30 capsule 2   buPROPion (WELLBUTRIN XL) 300 MG 24 hr tablet Take 1 tablet (300 mg total) by mouth every morning. 30 tablet 2   cetirizine (ZYRTEC ALLERGY) 10 MG tablet Take 1 tablet (10 mg total) by mouth daily. 30 tablet 1   cloNIDine (CATAPRES) 0.1 MG tablet Take 0.5-1 tablets (0.05-0.1 mg total) by mouth 3 (three) times daily as needed.Not to exceed 2 full tablets per day. 60 tablet 2   loratadine (CLARITIN) 10 MG tablet Take 10 mg by mouth daily as needed for allergies.     Melatonin 10 MG TABS Take 10 mg by mouth at bedtime as needed. 30 tablet 2   naltrexone (DEPADE) 50 MG tablet Take 1 tablet (50 mg total) by mouth at bedtime. 30 tablet 2   No facility-administered medications prior to visit.   Allergies  Allergen Reactions   Amoxicillin Nausea And Vomiting   Penicillins Nausea And Vomiting   Objective:   Today's Vitals   02/07/23 0853  BP: 122/76  Pulse: 97  Temp: 98.5 F (36.9 C)  TempSrc: Temporal  SpO2: 100%  Weight: 149 lb 6.4 oz (67.8 kg)  Height: 5' 7.25" (1.708 m)   Body mass index is 23.23 kg/m.   General: Well developed, well nourished. No acute distress. HEENT: Normocephalic, non-traumatic. PERRL, EOMI. Conjunctiva clear. External ears normal. EAC and TMs normal   bilaterally. Nose clear without congestion or rhinorrhea. Mucous membranes moist. Oropharynx clear.  Good dentition. Neck: Supple. No lymphadenopathy. No thyromegaly. Lungs: Clear to auscultation bilaterally. No wheezing, rales or rhonchi. CV: RRR without murmurs or rubs. Pulses 2+ bilaterally. Abdomen: Soft. Bowel sounds positive, normal pitch and frequency. No hepatosplenomegaly. Notes mild tenderness in   the LLQ. No rebound or guarding. Back: Straight. No CVA tenderness bilaterally. Extremities: Full ROM. No joint swelling or tenderness. No edema noted. Skin: Warm and dry. No rashes. Psych: Alert and oriented. Normal mood and affect.  There are no preventive care reminders to display for this patient.    Assessment & Plan:   Problem List Items Addressed This Visit       Other   Alcohol use  disorder, severe, in sustained remission, dependence (HCC)    Maintaining sobriety. Continue counseling and naltrexone 50 mg daily. I will assess for complications of prior alcohol abuse.      Relevant Orders   CBC   Comprehensive metabolic panel   Urinalysis, Routine w reflex microscopic   Tobacco use disorder    Currently smoking a few puffs of 1-2 cigarettes a day. Encourage Mr. Amari to quit when he is ready.      Other Visit Diagnoses     Annual physical exam    -  Primary   Overall good health. UTD on recommended screenings and immunizations.   Screen for STD (sexually transmitted disease)       I will screen for STDs in light of past history and complaint of urethral discharge.   Relevant Orders   HIV Antibody (routine testing w rflx)   RPR   Urinalysis, Routine w reflex microscopic   Chlamydia/GC NAA, Confirmation       Return in about 1 year (around 02/07/2024) for Annual preventative care.   Loyola Mast, MD

## 2023-02-07 NOTE — Assessment & Plan Note (Addendum)
Maintaining sobriety. Continue counseling and naltrexone 50 mg daily. I will assess for complications of prior alcohol abuse.

## 2023-02-09 LAB — RPR: RPR Ser Ql: NONREACTIVE

## 2023-02-09 LAB — EXTRA LAV TOP TUBE

## 2023-02-09 LAB — HIV ANTIBODY (ROUTINE TESTING W REFLEX): HIV 1&2 Ab, 4th Generation: NONREACTIVE

## 2023-02-11 LAB — CHLAMYDIA/GC NAA, CONFIRMATION
Chlamydia trachomatis, NAA: NEGATIVE
Neisseria gonorrhoeae, NAA: NEGATIVE

## 2023-02-15 ENCOUNTER — Other Ambulatory Visit: Payer: Self-pay

## 2023-02-23 ENCOUNTER — Other Ambulatory Visit: Payer: Self-pay

## 2023-02-23 ENCOUNTER — Encounter (HOSPITAL_COMMUNITY): Payer: Self-pay | Admitting: Physician Assistant

## 2023-02-23 ENCOUNTER — Ambulatory Visit (INDEPENDENT_AMBULATORY_CARE_PROVIDER_SITE_OTHER): Payer: MEDICAID | Admitting: Physician Assistant

## 2023-02-23 VITALS — BP 136/91 | HR 90 | Ht 67.0 in | Wt 150.8 lb

## 2023-02-23 DIAGNOSIS — F902 Attention-deficit hyperactivity disorder, combined type: Secondary | ICD-10-CM | POA: Diagnosis not present

## 2023-02-23 DIAGNOSIS — F331 Major depressive disorder, recurrent, moderate: Secondary | ICD-10-CM | POA: Diagnosis not present

## 2023-02-23 DIAGNOSIS — F411 Generalized anxiety disorder: Secondary | ICD-10-CM | POA: Diagnosis not present

## 2023-02-23 DIAGNOSIS — F5104 Psychophysiologic insomnia: Secondary | ICD-10-CM

## 2023-02-23 DIAGNOSIS — F1020 Alcohol dependence, uncomplicated: Secondary | ICD-10-CM

## 2023-02-23 MED ORDER — MELATONIN 10 MG PO TABS
10.0000 mg | ORAL_TABLET | Freq: Every evening | ORAL | 2 refills | Status: DC | PRN
  Filled 2023-02-23: qty 30, fill #0

## 2023-02-23 MED ORDER — NALTREXONE HCL 50 MG PO TABS
50.0000 mg | ORAL_TABLET | Freq: Every day | ORAL | 2 refills | Status: DC
  Filled 2023-03-20: qty 30, 30d supply, fill #0

## 2023-02-23 MED ORDER — BUSPIRONE HCL 7.5 MG PO TABS
7.5000 mg | ORAL_TABLET | Freq: Two times a day (BID) | ORAL | 1 refills | Status: DC
  Filled 2023-02-23: qty 60, 30d supply, fill #0
  Filled 2023-03-20: qty 60, 30d supply, fill #1

## 2023-02-23 MED ORDER — BUPROPION HCL ER (XL) 300 MG PO TB24
300.0000 mg | ORAL_TABLET | ORAL | 2 refills | Status: DC
  Filled 2023-02-23 – 2023-03-20 (×2): qty 30, 30d supply, fill #0

## 2023-02-23 MED ORDER — CLONIDINE HCL 0.1 MG PO TABS
0.0500 mg | ORAL_TABLET | Freq: Three times a day (TID) | ORAL | 2 refills | Status: DC | PRN
  Filled 2023-04-02: qty 60, 30d supply, fill #0

## 2023-02-23 MED ORDER — ATOMOXETINE HCL 40 MG PO CAPS
40.0000 mg | ORAL_CAPSULE | Freq: Every day | ORAL | 2 refills | Status: DC
  Filled 2023-02-23 – 2023-04-02 (×2): qty 30, 30d supply, fill #0

## 2023-02-23 NOTE — Progress Notes (Signed)
BH MD/PA/NP OP Progress Note  02/23/2023 5:46 PM Patrick Alexander  MRN:  [id number]  Chief Complaint:  Chief Complaint  Patient presents with   Follow-up   Medication Management   HPI:   Patrick Alexander is a 37 year old, Caucasian male with a past psychiatric history significant for attention deficit hyperactivity disorder (combined type), alcohol use disorder (severe/dependence, in early remission), generalized anxiety disorder, chronic PTSD, and major depressive disorder who presents to [facility] for follow-up and medication management.  Patient is currently being managed on the following psychiatric medications:  Strattera 40 mg daily Naltrexone 50 mg daily Bupropion (Wellbutrin XL) 300 mg 24-hour tablet daily Clonidine 0.1 mg (0.5 to 1 tablet) by mouth 3 times daily as needed Melatonin 3 mg at bedtime as needed  Patient reports that he can tell a difference with the adjustments made to their medication regimen.  They report that the Wellbutrin works well in managing the depression.  He also reports that ever since his Strattera dosage has been adjusted, he has been taking his Strattera occasionally.  Patient reports that his anxiety is through the roof and attributes his elevated anxiety to not getting drunk or high.  He reports that he feels more aware since he is not drinking or indulging in illicit substances which causes his anxiety.  Patient rates their anxiety an 8 out of 10 and states that everything makes him want to take an opiate.  Patient also reports that he is experiencing symptoms of PTSD.  He reports that his PTSD is often triggered when being around multiple people or being in situations that he cannot escape from.  For example, patient reports that he witnessed someone shoplift meat from the market the other day which sent him over the edge.  In addition to anxiety, patient reports that he has been experiencing panic attacks over things  he cannot control.  He also reports that he feels that his issues with agoraphobia have been slowly creeping back in.  Although patient endorses anxiety, panic attacks, and symptoms of PTSD, he reports that at least 70% of him is good.  He reports that the other 30% of him that is not good occasionally becomes uncontrollable and "swallows up the 70%" of him and that is good.  He reports that his use of clonidine has been helpful in managing his anxiety.  He also reports maintaining a good routine, such as working out regularly, has helped keep him levelheaded.  Patient reports that the upcoming holidays freaks him out due to the possibility of him going crazy through substance use.  He reports that thinking about not maintaining his sobriety makes him feel like he is in a bubble of anxiety.  He reports that he recently went to see his pharmacist and ended up having an anxiety attack even though he has seen this pharmacist regularly.  Patient believes that his anxiety is attributed to things he is unable to control that are environmental and spatial in nature.  Prior to being taken off of Zoloft, patient states that he felt laid back.  In addition to his anxiety, patient reports that he feels a little more depressed and describes himself as feeling like mush.  A PHQ-9 screen was performed with the patient scoring a 5.  A GAD-7 screen was also performed with the patient scoring a 14.  Patient is alert and oriented x 4, pleasant, calm, cooperative, and fully engaged in conversation during the encounter.  Patient describes his  mood as ambitious stating that he often gets this way when anxious.  He reports that when he is anxious, and makes him want to do stuff.  He denies suicidal or homicidal ideations.  He further denies auditory or visual hallucinations and does not appear to be responding to internal soft external stimuli.  Patient endorses good sleep and receives on average 7 to 8 hours of sleep per night.   Patient endorses good appetite and eats on average 3 meals per day.  Patient denies alcohol consumption or illicit drug use.  Patient endorses tobacco use and smokes on average 2 cigarettes/day.  Visit Diagnosis:    ICD-10-CM   1. Generalized anxiety disorder  F41.1 busPIRone (BUSPAR) 7.5 MG tablet    cloNIDine (CATAPRES) 0.1 MG tablet    2. Alcohol use disorder, severe, dependence (HCC)  F10.20 naltrexone (DEPADE) 50 MG tablet    3. Attention deficit hyperactivity disorder (ADHD), combined type  F90.2 atomoxetine (STRATTERA) 40 MG capsule    buPROPion (WELLBUTRIN XL) 300 MG 24 hr tablet    cloNIDine (CATAPRES) 0.1 MG tablet    4. Moderate episode of recurrent major depressive disorder (HCC)  F33.1 buPROPion (WELLBUTRIN XL) 300 MG 24 hr tablet    5. Psychophysiological insomnia  F51.04 Melatonin 10 MG TABS       Past Psychiatric History:  Patient has a past Patrick Alexander history significant for attention deficit hyperactivity disorder, major depressive disorder, PTSD, polysubstance abuse, and generalized anxiety disorder   Patient reports that he has a past history of hospitalization due to mental health.  Patient reports that the hospitalization occurred around the time he was 41 when he had moved to [place].  Patient reports that he is originally from a [address] town and once he moved to [place], he wanted to leave and by experimenting with drugs.  Patient states that he grew up sheltered and once he moved to [place], he states that he rebelled against the environment he grew up in.   Patient denies a past history of suicide attempt but states that he has had suicidal thoughts in the past   Patient denies a past history of homicide  Past Medical History:  Past Medical History:  Diagnosis Date   ADD (attention deficit disorder)    Allergy    Anxiety    Depression    Hemorrhoids 02/03/2015   High risk sexual behavior 02/03/2015   History of admission to inpatient psychiatry  department 03/09/2022   History of non-suicidal self-harm 03/09/2022   Hypertension    Sleep apnea    Sleep disorder 06/02/2015   Substance abuse (HCC)    Tobacco use disorder 06/02/2015    Past Surgical History:  Procedure Laterality Date   TONSILLECTOMY     TYMPANOSTOMY TUBE PLACEMENT     WISDOM TOOTH EXTRACTION      Family Psychiatric History:  Patient reports that mental health runs on his father's side of the family.  Patient reports that PTSD and generalized anxiety are common on his father's side of the family   Father - Generalized anxiety disorder and major depressive disorder Grandfather (paternal) - Generalized anxiety disorder and major depressive disorder.  Patient reports that his grandfather had a past psychiatric history of paranoid schizophrenia and spent extended periods of time in the hospital   Family history of suicide attempt: Patient denies Family history of homicide: Patient denies Family history of substance abuse: Patient reports that his cousins on his mother's side of the family all struggle with  substance abuse  Family History:  Family History  Problem Relation Age of Onset   Allergies Mother    Hyperlipidemia Mother    Diabetes Mother    Cancer Mother        Endometrial   Arthritis Mother    Varicose Veins Mother    Allergies Father    Hypertension Father    Stroke Father    Depression Father    Hyperlipidemia Father    Anxiety disorder Father    Arthritis Father    Cancer Father        Skin, lymphoma   Heart disease Father    Vision loss Father    Allergies Sister    Hyperlipidemia Sister    ADD / ADHD Sister    Varicose Veins Sister    Alcohol abuse Maternal Aunt    Cancer Maternal Aunt        Skin   Miscarriages / Stillbirths Maternal Aunt    Arthritis Maternal Grandmother    Cancer Maternal Grandmother        Glioblastoma   Cancer Maternal Grandfather        Prostate   Cancer Paternal Grandmother        Breast   Dementia  Paternal Grandmother    Anxiety disorder Paternal Grandfather    Depression Paternal Grandfather    Heart disease Paternal Grandfather    Hypertension Paternal Grandfather    Schizophrenia Paternal Grandfather     Social History:  Social History   Socioeconomic History   Marital status: Single    Spouse name: Not on file   Number of children: 0   Years of education: Not on file   Highest education level: Associate degree: occupational, Scientist, product/process development, or vocational program  Occupational History   Occupation: Self-employed    Comment: Variety of jobs when available.  Tobacco Use   Smoking status: Every Day    Current packs/day: 0.00    Average packs/day: 0.3 packs/day for 17.0 years (4.3 ttl pk-yrs)    Types: Cigarettes    Last attempt to quit: 01/30/2015    Years since quitting: 8.0   Smokeless tobacco: Not on file   Tobacco comments:    About 2/day  Vaping Use   Vaping status: Never Used  Substance and Sexual Activity   Alcohol use: Not Currently   Drug use: Not Currently    Types: Amphetamines, Benzodiazepines, Cocaine, Codeine, Hydrocodone, Marijuana, MDMA (Ecstacy), Methylphenidate, Oxycodone, Psilocybin   Sexual activity: Not Currently    Birth control/protection: None  Other Topics Concern   Not on file  Social History Narrative   Not on file   Social Determinants of Health   Financial Resource Strain: Medium Risk (02/06/2023)   Overall Financial Resource Strain (CARDIA)    Difficulty of Paying Living Expenses: Somewhat hard  Food Insecurity: Food Insecurity Present (02/06/2023)   Hunger Vital Sign    Worried About Running Out of Food in the Last Year: Never true    Ran Out of Food in the Last Year: Sometimes true  Transportation Needs: Unmet Transportation Needs (02/06/2023)   PRAPARE - Transportation    Lack of Transportation (Medical): No    Lack of Transportation (Non-Medical): Yes  Physical Activity: Sufficiently Active (02/06/2023)   Exercise Vital Sign     Days of Exercise per Week: 7 days    Minutes of Exercise per Session: 130 min  Stress: No Stress Concern Present (02/06/2023)   [employer] of Occupational Health - Occupational Stress Questionnaire  Feeling of Stress : Not at all  Social Connections: Moderately Integrated (02/06/2023)   Social Connection and Isolation Panel [NHANES]    Frequency of Communication with Friends and Family: Three times a week    Frequency of Social Gatherings with Friends and Family: Twice a week    Attends Religious Services: More than 4 times per year    Active Member of [employer] or Organizations: Yes    Attends Engineer, structural: More than 4 times per year    Marital Status: Never married    Allergies:  Allergies  Allergen Reactions   Amoxicillin Nausea And Vomiting   Penicillins Nausea And Vomiting    Metabolic Disorder Labs: Lab Results  Component Value Date   HGBA1C 5.3 03/09/2022   MPG 105.41 03/09/2022   No results found for: "PROLACTIN" Lab Results  Component Value Date   CHOL 192 03/09/2022   TRIG 104 03/09/2022   HDL 73 03/09/2022   CHOLHDL 2.6 03/09/2022   VLDL 21 03/09/2022   LDLCALC 98 03/09/2022   LDLCALC 120 04/23/2015   Lab Results  Component Value Date   TSH 2.539 03/09/2022    Therapeutic Level Labs: No results found for: "LITHIUM" No results found for: "VALPROATE" No results found for: "CBMZ"  Current Medications: Current Outpatient Medications  Medication Sig Dispense Refill   busPIRone (BUSPAR) 7.5 MG tablet Take 1 tablet (7.5 mg total) by mouth 2 (two) times daily. 60 tablet 1   acetaminophen (TYLENOL) 325 MG tablet Take 650 mg by mouth every 6 (six) hours as needed.     atomoxetine (STRATTERA) 40 MG capsule Take 1 capsule (40 mg total) by mouth daily. 30 capsule 2   buPROPion (WELLBUTRIN XL) 300 MG 24 hr tablet Take 1 tablet (300 mg total) by mouth every morning. 30 tablet 2   cetirizine (ZYRTEC ALLERGY) 10 MG tablet Take 1 tablet (10  mg total) by mouth daily. 30 tablet 1   cloNIDine (CATAPRES) 0.1 MG tablet Take 0.5-1 tablets (0.05-0.1 mg total) by mouth 3 (three) times daily as needed.Not to exceed 2 full tablets per day. 60 tablet 2   loratadine (CLARITIN) 10 MG tablet Take 10 mg by mouth daily as needed for allergies.     Melatonin 10 MG TABS Take 10 mg by mouth at bedtime as needed. 30 tablet 2   naltrexone (DEPADE) 50 MG tablet Take 1 tablet (50 mg total) by mouth at bedtime. 30 tablet 2   No current facility-administered medications for this visit.     Musculoskeletal: Strength & Muscle Tone: within normal limits Gait & Station: normal Patient leans: N/A  Psychiatric Specialty Exam: Review of Systems  Psychiatric/Behavioral:  Negative for decreased concentration, dysphoric mood, hallucinations, self-injury, sleep disturbance and suicidal ideas. The patient is nervous/anxious. The patient is not hyperactive.     Blood pressure (!) 136/91, pulse 90, height 5\' 7"  (1.702 m), weight 150 lb 12.8 oz (68.4 kg), SpO2 100%.Body mass index is 23.62 kg/m.  General Appearance: Casual  Eye Contact:  Good  Speech:  Clear and Coherent and Normal Rate  Volume:  Normal  Mood:  Anxious and Depressed  Affect:  Appropriate  Thought Process:  Coherent, Goal Directed, and Descriptions of Associations: Intact  Orientation:  Full (Time, Place, and Person)  Thought Content: WDL   Suicidal Thoughts:  No  Homicidal Thoughts:  No  Memory:  Immediate;   Good Recent;   Good Remote;   Good  Judgement:  Good  Insight:  Good  Psychomotor Activity:  Normal  Concentration:  Concentration: Good and Attention Span: Good  Recall:  Good  Fund of Knowledge: Good  Language: Good  Akathisia:  No  Handed:  Right  AIMS (if indicated): not done  Assets:  Communication Skills Desire for Improvement Housing Resilience Social Support Talents/Skills Vocational/Educational  ADL's:  Intact  Cognition: WNL  Sleep:  Good    Screenings: GAD-7    Flowsheet Row Clinical Support from 02/23/2023 in [facility] Office Visit from 02/07/2023 in [facility] [employer] Visit from 01/05/2023 in [facility] [employer] Clinical Support from 12/21/2022 in [facility] Clinical Support from 10/19/2022 in [facility]  Total GAD-7 Score 14 6 9 7 2       PHQ2-9    Flowsheet Row Clinical Support from 02/23/2023 in [facility] Office Visit from 02/07/2023 in [facility] Visit from 01/05/2023 in [facility] [employer] Clinical Support from 12/21/2022 in [facility] Clinical Support from 10/19/2022 in [facility]  PHQ-2 Total Score 3 1 3 1 2   PHQ-9 Total Score 5 2 5  -- 4      Flowsheet Row Clinical Support from 02/23/2023 in [facility] Clinical Support from 12/21/2022 in [facility] Clinical Support from 10/19/2022 in [facility]  C-SSRS RISK CATEGORY Moderate Risk Low Risk Low Risk        Assessment and Plan:   Zaniel Kosar is a 37 year old, Caucasian male with a past psychiatric history significant for attention deficit hyperactivity disorder (combined type), alcohol use disorder (severe/dependence, in early remission), generalized anxiety disorder, chronic PTSD, and major depressive disorder who presents to [facility] for follow-up and medication management.  Patient presents to the encounter stating that his use of Wellbutrin has been helpful in managing his depression.  He also reports that he uses Strattera on occasions ever since the dosage was readjusted.  Although patient's depression appears to be  manageable, he does report that he feels well and describes the feeling as feeling like mush.  He also endorses anxiety attributed to factors in his environment that he is unable to control as well as not being able to get high or drunk.  Patient also endorses symptoms of PTSD attributed to being around people and being in situations he is unable to control.  This lack of control also triggered the patient to having panic attacks.  He reports that clonidine is somewhat helpful in managing his anxiety as well as maintaining a good routine such as working out regularly.  Patient endorses some fear over the upcoming holidays due to the possibility of relapsing.  Provider informed patient that he was previously on Zoloft before being taken off the medication.  Patient was informed that Zoloft may have been helping with his anxiety prior to discontinuing the medication.  Provider recommended patient be placed on another anxiolytic for the management of his anxiety.  Provider recommended buspirone 7.5 mg 2 times daily for the management of his anxiety.  Patient was agreeable to recommendation.  Patient to continue taking all other medications as prescribed.  Patient's medications to be e-prescribed to pharmacy of choice.  Collaboration of Care: Collaboration of Care: Medication Management AEB provider managing patient's psychiatric medications and Psychiatrist AEB patient being followed  by mental health provider at this facility  Patient/Guardian was advised Release of Information must be obtained prior to any record release in order to collaborate their care with an outside provider. Patient/Guardian was advised if they have not already done so to contact the registration department to sign all necessary forms in order for [place] to release information regarding their care.   Consent: Patient/Guardian gives verbal consent for treatment and assignment of benefits for services provided during this visit.  Patient/Guardian expressed understanding and agreed to proceed.   1. Generalized anxiety disorder  - busPIRone (BUSPAR) 7.5 MG tablet; Take 1 tablet (7.5 mg total) by mouth 2 (two) times daily.  Dispense: 60 tablet; Refill: 1 - cloNIDine (CATAPRES) 0.1 MG tablet; Take 0.5-1 tablets (0.05-0.1 mg total) by mouth 3 (three) times daily as needed.Not to exceed 2 full tablets per day.  Dispense: 60 tablet; Refill: 2  2. Alcohol use disorder, severe, dependence (HCC)  - naltrexone (DEPADE) 50 MG tablet; Take 1 tablet (50 mg total) by mouth at bedtime.  Dispense: 30 tablet; Refill: 2  3. Attention deficit hyperactivity disorder (ADHD), combined type  - atomoxetine (STRATTERA) 40 MG capsule; Take 1 capsule (40 mg total) by mouth daily.  Dispense: 30 capsule; Refill: 2 - buPROPion (WELLBUTRIN XL) 300 MG 24 hr tablet; Take 1 tablet (300 mg total) by mouth every morning.  Dispense: 30 tablet; Refill: 2 - cloNIDine (CATAPRES) 0.1 MG tablet; Take 0.5-1 tablets (0.05-0.1 mg total) by mouth 3 (three) times daily as needed.Not to exceed 2 full tablets per day.  Dispense: 60 tablet; Refill: 2  4. Moderate episode of recurrent major depressive disorder (HCC)  - buPROPion (WELLBUTRIN XL) 300 MG 24 hr tablet; Take 1 tablet (300 mg total) by mouth every morning.  Dispense: 30 tablet; Refill: 2  5. Psychophysiological insomnia  - Melatonin 10 MG TABS; Take 10 mg by mouth at bedtime as needed.  Dispense: 30 tablet; Refill: 2  Patient to follow-up in 2 months Provider spent a total of 19 minutes with the patient/reviewing patient's chart  Meta Hatchet, PA 02/23/2023, 5:46 PM

## 2023-02-28 ENCOUNTER — Other Ambulatory Visit (HOSPITAL_BASED_OUTPATIENT_CLINIC_OR_DEPARTMENT_OTHER): Payer: Self-pay

## 2023-03-21 ENCOUNTER — Other Ambulatory Visit: Payer: Self-pay

## 2023-04-02 ENCOUNTER — Encounter (INDEPENDENT_AMBULATORY_CARE_PROVIDER_SITE_OTHER): Payer: Self-pay

## 2023-04-04 ENCOUNTER — Other Ambulatory Visit: Payer: Self-pay

## 2023-04-06 ENCOUNTER — Other Ambulatory Visit: Payer: Self-pay

## 2023-04-06 ENCOUNTER — Encounter (HOSPITAL_COMMUNITY): Payer: Self-pay | Admitting: Physician Assistant

## 2023-04-06 ENCOUNTER — Ambulatory Visit (INDEPENDENT_AMBULATORY_CARE_PROVIDER_SITE_OTHER): Payer: MEDICAID | Admitting: Physician Assistant

## 2023-04-06 DIAGNOSIS — F411 Generalized anxiety disorder: Secondary | ICD-10-CM | POA: Diagnosis not present

## 2023-04-06 DIAGNOSIS — F5104 Psychophysiologic insomnia: Secondary | ICD-10-CM | POA: Diagnosis not present

## 2023-04-06 DIAGNOSIS — F331 Major depressive disorder, recurrent, moderate: Secondary | ICD-10-CM

## 2023-04-06 DIAGNOSIS — F1020 Alcohol dependence, uncomplicated: Secondary | ICD-10-CM

## 2023-04-06 DIAGNOSIS — F902 Attention-deficit hyperactivity disorder, combined type: Secondary | ICD-10-CM

## 2023-04-06 MED ORDER — BUPROPION HCL ER (XL) 300 MG PO TB24
300.0000 mg | ORAL_TABLET | ORAL | 2 refills | Status: DC
  Filled 2023-04-06 – 2023-04-15 (×2): qty 30, 30d supply, fill #0
  Filled 2023-05-15: qty 30, 30d supply, fill #1

## 2023-04-06 MED ORDER — ATOMOXETINE HCL 40 MG PO CAPS
40.0000 mg | ORAL_CAPSULE | Freq: Every day | ORAL | 2 refills | Status: DC
  Filled 2023-04-06 – 2023-04-29 (×2): qty 30, 30d supply, fill #0
  Filled 2023-05-29: qty 30, 30d supply, fill #1

## 2023-04-06 MED ORDER — NALTREXONE HCL 50 MG PO TABS
50.0000 mg | ORAL_TABLET | Freq: Every day | ORAL | 2 refills | Status: DC
  Filled 2023-04-06 – 2023-04-22 (×2): qty 30, 30d supply, fill #0
  Filled 2023-05-27: qty 30, 30d supply, fill #1

## 2023-04-06 MED ORDER — CLONIDINE HCL 0.1 MG PO TABS
0.0500 mg | ORAL_TABLET | Freq: Three times a day (TID) | ORAL | 2 refills | Status: DC | PRN
  Filled 2023-04-06: qty 60, 20d supply, fill #0
  Filled 2023-04-29: qty 60, 30d supply, fill #0

## 2023-04-06 MED ORDER — MELATONIN 10 MG PO TABS
10.0000 mg | ORAL_TABLET | Freq: Every evening | ORAL | 2 refills | Status: DC | PRN
  Filled 2023-04-06: qty 30, fill #0

## 2023-04-06 MED ORDER — BUSPIRONE HCL 7.5 MG PO TABS
7.5000 mg | ORAL_TABLET | Freq: Three times a day (TID) | ORAL | 2 refills | Status: DC
  Filled 2023-04-06 – 2023-04-14 (×3): qty 90, 30d supply, fill #0
  Filled 2023-05-10: qty 90, 30d supply, fill #1

## 2023-04-06 NOTE — Progress Notes (Unsigned)
BH MD/PA/NP OP Progress Note  04/06/2023 2:11 PM Patrick Alexander  MRN:  [id number]  Chief Complaint:  Chief Complaint  Patient presents with   Follow-up   Medication Management   HPI:   Patrick Alexander is a 37 year old, Caucasian male with a past psychiatric history significant for attention deficit hyperactivity disorder (combined type), alcohol use disorder (severe/dependence, in early remission), generalized anxiety disorder, chronic PTSD, and major depressive disorder who presents to [facility] for follow-up and medication management.  Patient is currently being managed on the following psychiatric medications:  Strattera 40 mg daily Naltrexone 50 mg daily Bupropion (Wellbutrin XL) 300 mg 24-hour tablet daily Clonidine 0.1 mg (0.5 to 1 tablet) by mouth 3 times daily as needed Melatonin 10 mg at bedtime as needed Buspirone 7.5 mg 2 times daily  Patient reports that his use of buspirone has gradually improved his anxiety over time.  He reports that the holiday season is usually a trigger for him to want to use substances; however, this has not been the case since being on his medications.  He reports that his depression is slowly getting better and states that keeping himself busy tends to ward off the depression.  He reports that his anxiety is generally worse than his depression.  The level of his anxiety is dependent on the day and notes that some of his anxiety is attributed to being in situations that he rather not be involved in.  He reports not liking confrontation which is a big contributor to his anxiety.  Patient acknowledges that the medications are helpful but states that his issues with confrontations is something that he will have to work through.  Patient rates his anxiety as 5 out of 10.  A GAD-7 screen was performed with the patient scoring an 8.  Patient is alert and oriented x 4, pleasant, calm, cooperative, and fully engaged in  conversation during the encounter.  Patient endorses relaxed mood.  Patient denies suicidal or homicidal ideations.  Patient denies auditory or visual hallucinations and does not appear to be responding to internal/external stimuli.  Patient endorses good sleep and receives on average 7 to 8 hours of sleep per night.  Patient endorses good appetite and eats on average 3-5 meals per day.  Patient denies alcohol consumption or illicit drug use.  Patient endorses tobacco use and smokes on average 2 to 3 cigarettes/day.  Visit Diagnosis:    ICD-10-CM   1. Generalized anxiety disorder  F41.1 cloNIDine (CATAPRES) 0.1 MG tablet    busPIRone (BUSPAR) 7.5 MG tablet    2. Attention deficit hyperactivity disorder (ADHD), combined type  F90.2 cloNIDine (CATAPRES) 0.1 MG tablet    buPROPion (WELLBUTRIN XL) 300 MG 24 hr tablet    atomoxetine (STRATTERA) 40 MG capsule    3. Psychophysiological insomnia  F51.04 Melatonin 10 MG TABS    4. Moderate episode of recurrent major depressive disorder (HCC)  F33.1 buPROPion (WELLBUTRIN XL) 300 MG 24 hr tablet    5. Alcohol use disorder, severe, dependence (HCC)  F10.20 naltrexone (DEPADE) 50 MG tablet       Past Psychiatric History:  Patient has a past [name] history significant for attention deficit hyperactivity disorder, major depressive disorder, PTSD, polysubstance abuse, and generalized anxiety disorder   Patient reports that he has a past history of hospitalization due to mental health.  Patient reports that the hospitalization occurred around the time he was 41 when he had moved to [place].  Patient reports that  he is originally from a [address] town and once he moved to [place], he wanted to leave and by experimenting with drugs.  Patient states that he grew up sheltered and once he moved to [place], he states that he rebelled against the environment he grew up in.   Patient denies a past history of suicide attempt but states that he has had suicidal  thoughts in the past   Patient denies a past history of homicide  Past Medical History:  Past Medical History:  Diagnosis Date   ADD (attention deficit disorder)    Allergy    Anxiety    Depression    Hemorrhoids 02/03/2015   High risk sexual behavior 02/03/2015   History of admission to inpatient psychiatry department 03/09/2022   History of non-suicidal self-harm 03/09/2022   Hypertension    Sleep apnea    Sleep disorder 06/02/2015   Substance abuse (HCC)    Tobacco use disorder 06/02/2015    Past Surgical History:  Procedure Laterality Date   TONSILLECTOMY     TYMPANOSTOMY TUBE PLACEMENT     WISDOM TOOTH EXTRACTION      Family Psychiatric History:  Patient reports that mental health runs on his father's side of the family.  Patient reports that PTSD and generalized anxiety are common on his father's side of the family   Father - Generalized anxiety disorder and major depressive disorder Grandfather (paternal) - Generalized anxiety disorder and major depressive disorder.  Patient reports that his grandfather had a past psychiatric history of paranoid schizophrenia and spent extended periods of time in the hospital   Family history of suicide attempt: Patient denies Family history of homicide: Patient denies Family history of substance abuse: Patient reports that his cousins on his mother's side of the family all struggle with substance abuse  Family History:  Family History  Problem Relation Age of Onset   Allergies Mother    Hyperlipidemia Mother    Diabetes Mother    Cancer Mother        Endometrial   Arthritis Mother    Varicose Veins Mother    Allergies Father    Hypertension Father    Stroke Father    Depression Father    Hyperlipidemia Father    Anxiety disorder Father    Arthritis Father    Cancer Father        Skin, lymphoma   Heart disease Father    Vision loss Father    Allergies Sister    Hyperlipidemia Sister    ADD / ADHD Sister    Varicose  Veins Sister    Alcohol abuse Maternal Aunt    Cancer Maternal Aunt        Skin   Miscarriages / Stillbirths Maternal Aunt    Arthritis Maternal Grandmother    Cancer Maternal Grandmother        Glioblastoma   Cancer Maternal Grandfather        Prostate   Cancer Paternal Grandmother        Breast   Dementia Paternal Grandmother    Anxiety disorder Paternal Grandfather    Depression Paternal Grandfather    Heart disease Paternal Grandfather    Hypertension Paternal Grandfather    Schizophrenia Paternal Grandfather     Social History:  Social History   Socioeconomic History   Marital status: Single    Spouse name: Not on file   Number of children: 0   Years of education: Not on file   Highest education level:  Associate degree: occupational, technical, or vocational program  Occupational History   Occupation: Self-employed    Comment: Variety of jobs when available.  Tobacco Use   Smoking status: Every Day    Current packs/day: 0.00    Average packs/day: 0.3 packs/day for 17.0 years (4.3 ttl pk-yrs)    Types: Cigarettes    Last attempt to quit: 01/30/2015    Years since quitting: 8.1   Smokeless tobacco: Not on file   Tobacco comments:    About 2/day  Vaping Use   Vaping status: Never Used  Substance and Sexual Activity   Alcohol use: Not Currently   Drug use: Not Currently    Types: Amphetamines, Benzodiazepines, Cocaine, Codeine, Hydrocodone, Marijuana, MDMA (Ecstacy), Methylphenidate, Oxycodone, Psilocybin   Sexual activity: Not Currently    Birth control/protection: None  Other Topics Concern   Not on file  Social History Narrative   Not on file   Social Drivers of Health   Financial Resource Strain: Medium Risk (02/06/2023)   Overall Financial Resource Strain (CARDIA)    Difficulty of Paying Living Expenses: Somewhat hard  Food Insecurity: Food Insecurity Present (02/06/2023)   Hunger Vital Sign    Worried About Running Out of Food in the Last Year:  Never true    Ran Out of Food in the Last Year: Sometimes true  Transportation Needs: Unmet Transportation Needs (02/06/2023)   PRAPARE - Transportation    Lack of Transportation (Medical): No    Lack of Transportation (Non-Medical): Yes  Physical Activity: Sufficiently Active (02/06/2023)   Exercise Vital Sign    Days of Exercise per Week: 7 days    Minutes of Exercise per Session: 130 min  Stress: No Stress Concern Present (02/06/2023)   [employer] of Occupational Health - Occupational Stress Questionnaire    Feeling of Stress : Not at all  Social Connections: Moderately Integrated (02/06/2023)   Social Connection and Isolation Panel [NHANES]    Frequency of Communication with Friends and Family: Three times a week    Frequency of Social Gatherings with Friends and Family: Twice a week    Attends Religious Services: More than 4 times per year    Active Member of [employer] or Organizations: Yes    Attends Engineer, structural: More than 4 times per year    Marital Status: Never married    Allergies:  Allergies  Allergen Reactions   Amoxicillin Nausea And Vomiting   Penicillins Nausea And Vomiting    Metabolic Disorder Labs: Lab Results  Component Value Date   HGBA1C 5.3 03/09/2022   MPG 105.41 03/09/2022   No results found for: "PROLACTIN" Lab Results  Component Value Date   CHOL 192 03/09/2022   TRIG 104 03/09/2022   HDL 73 03/09/2022   CHOLHDL 2.6 03/09/2022   VLDL 21 03/09/2022   LDLCALC 98 03/09/2022   LDLCALC 120 04/23/2015   Lab Results  Component Value Date   TSH 2.539 03/09/2022    Therapeutic Level Labs: No results found for: "LITHIUM" No results found for: "VALPROATE" No results found for: "CBMZ"  Current Medications: Current Outpatient Medications  Medication Sig Dispense Refill   acetaminophen (TYLENOL) 325 MG tablet Take 650 mg by mouth every 6 (six) hours as needed.     atomoxetine (STRATTERA) 40 MG capsule Take 1 capsule (40 mg  total) by mouth daily. 30 capsule 2   buPROPion (WELLBUTRIN XL) 300 MG 24 hr tablet Take 1 tablet (300 mg total) by mouth every morning. 30 tablet  2   busPIRone (BUSPAR) 7.5 MG tablet Take 1 tablet (7.5 mg total) by mouth 3 (three) times daily. 90 tablet 2   cetirizine (ZYRTEC ALLERGY) 10 MG tablet Take 1 tablet (10 mg total) by mouth daily. 30 tablet 1   cloNIDine (CATAPRES) 0.1 MG tablet Take 0.5-1 tablets (0.05-0.1 mg total) by mouth 3 (three) times daily as needed.Not to exceed 2 full tablets per day. 60 tablet 2   loratadine (CLARITIN) 10 MG tablet Take 10 mg by mouth daily as needed for allergies.     Melatonin 10 MG TABS Take 10 mg by mouth at bedtime as needed. 30 tablet 2   naltrexone (DEPADE) 50 MG tablet Take 1 tablet (50 mg total) by mouth at bedtime. 30 tablet 2   No current facility-administered medications for this visit.     Musculoskeletal: Strength & Muscle Tone: within normal limits Gait & Station: normal Patient leans: N/A  Psychiatric Specialty Exam: Review of Systems  Psychiatric/Behavioral:  Negative for decreased concentration, dysphoric mood, hallucinations, self-injury, sleep disturbance and suicidal ideas. The patient is nervous/anxious. The patient is not hyperactive.     Blood pressure 131/85, pulse 92, temperature 98.2 F (36.8 C), temperature source Oral, height 5\' 7"  (1.702 m), weight 158 lb 12.8 oz (72 kg), SpO2 100%.Body mass index is 24.87 kg/m.  General Appearance: Casual  Eye Contact:  Good  Speech:  Clear and Coherent and Normal Rate  Volume:  Normal  Mood:  Anxious  Affect:  Appropriate  Thought Process:  Coherent, Goal Directed, and Descriptions of Associations: Intact  Orientation:  Full (Time, Place, and Person)  Thought Content: WDL   Suicidal Thoughts:  No  Homicidal Thoughts:  No  Memory:  Immediate;   Good Recent;   Good Remote;   Good  Judgement:  Good  Insight:  Good  Psychomotor Activity:  Normal  Concentration:   Concentration: Good and Attention Span: Good  Recall:  Good  Fund of Knowledge: Good  Language: Good  Akathisia:  No  Handed:  Right  AIMS (if indicated): not done  Assets:  Communication Skills Desire for Improvement Housing Resilience Social Support Talents/Skills Vocational/Educational  ADL's:  Intact  Cognition: WNL  Sleep:  Good   Screenings: GAD-7    Flowsheet Row Clinical Support from 04/06/2023 in [facility] Clinical Support from 02/23/2023 in [facility] Office Visit from 02/07/2023 in [facility] Office Visit from 01/05/2023 in [facility] Clinical Support from 12/21/2022 in [facility]  Total GAD-7 Score 8 14 6 9 7       PHQ2-9    Flowsheet Row Clinical Support from 04/06/2023 in [facility] Clinical Support from 02/23/2023 in [facility] Office Visit from 02/07/2023 in [facility] Visit from 01/05/2023 in [facility] Clinical Support from 12/21/2022 in [facility]  PHQ-2 Total Score 1 3 1 3 1   PHQ-9 Total Score -- 5 2 5  --      Flowsheet Row Clinical Support from 04/06/2023 in [facility] Clinical Support from 02/23/2023 in [facility] Clinical Support from 12/21/2022 in [facility]  C-SSRS RISK CATEGORY Moderate Risk Moderate Risk Low Risk        Assessment and Plan:   Patrick Alexander is a 37 year old,  Caucasian male with a past psychiatric history significant for attention deficit hyperactivity disorder (combined type), alcohol use disorder (severe/dependence, in early remission), generalized anxiety disorder, chronic PTSD, and major depressive disorder  who presents to [facility] for follow-up and medication management.  Patient presents to the encounter stating that his medications have gradually improved his anxiety over time.  Patient notes that he has also not wanted to use any illicit substances and has maintained his sobriety.  The only medication that has been added to his regimen since last encounter is buspirone.  He reports that he will occasionally take a third dose of buspirone during his day to help with his anxiety.  Patient attributes his ongoing anxiety to situational factors as well as not liking confrontation.  Although the medication has been helpful, patient reports that some of his anxiety is something he has to work through.  To help manage his anxiety, provider recommended patient be set up with a therapist.  Provider also recommended adjusting his buspirone from 7.5 mg 3 times daily to 7.5 mg 3 times daily.  Patient was agreeable to recommendations.  Patient's medications to be prescribed to pharmacy of choice.  Collaboration of Care: Collaboration of Care: Medication Management AEB provider managing patient's psychiatric medications and Psychiatrist AEB patient being followed by mental health provider at this facility  Patient/Guardian was advised Release of Information must be obtained prior to any record release in order to collaborate their care with an outside provider. Patient/Guardian was advised if they have not already done so to contact the registration department to sign all necessary forms in order for [place] to release information regarding their care.   Consent: Patient/Guardian gives verbal consent for treatment and assignment of benefits for services provided during this visit. Patient/Guardian expressed understanding and agreed to proceed.   1. Generalized anxiety disorder  - cloNIDine (CATAPRES) 0.1 MG tablet; Take 0.5-1 tablets (0.05-0.1 mg total) by mouth 3 (three) times  daily as needed.Not to exceed 2 full tablets per day.  Dispense: 60 tablet; Refill: 2 - busPIRone (BUSPAR) 7.5 MG tablet; Take 1 tablet (7.5 mg total) by mouth 3 (three) times daily.  Dispense: 90 tablet; Refill: 2  2. Attention deficit hyperactivity disorder (ADHD), combined type  - cloNIDine (CATAPRES) 0.1 MG tablet; Take 0.5-1 tablets (0.05-0.1 mg total) by mouth 3 (three) times daily as needed.Not to exceed 2 full tablets per day.  Dispense: 60 tablet; Refill: 2 - buPROPion (WELLBUTRIN XL) 300 MG 24 hr tablet; Take 1 tablet (300 mg total) by mouth every morning.  Dispense: 30 tablet; Refill: 2 - atomoxetine (STRATTERA) 40 MG capsule; Take 1 capsule (40 mg total) by mouth daily.  Dispense: 30 capsule; Refill: 2  3. Psychophysiological insomnia  - Melatonin 10 MG TABS; Take 10 mg by mouth at bedtime as needed.  Dispense: 30 tablet; Refill: 2  4. Moderate episode of recurrent major depressive disorder (HCC)  - buPROPion (WELLBUTRIN XL) 300 MG 24 hr tablet; Take 1 tablet (300 mg total) by mouth every morning.  Dispense: 30 tablet; Refill: 2  5. Alcohol use disorder, severe, dependence (HCC)  - naltrexone (DEPADE) 50 MG tablet; Take 1 tablet (50 mg total) by mouth at bedtime.  Dispense: 30 tablet; Refill: 2  Patient to follow-up in 2 months Provider spent a total of 29 minutes with the patient/reviewing patient's chart  Meta Hatchet, PA 04/06/2023, 2:11 PM

## 2023-04-08 ENCOUNTER — Other Ambulatory Visit: Payer: Self-pay

## 2023-04-15 ENCOUNTER — Other Ambulatory Visit: Payer: Self-pay

## 2023-04-22 ENCOUNTER — Other Ambulatory Visit: Payer: Self-pay

## 2023-04-26 ENCOUNTER — Other Ambulatory Visit: Payer: Self-pay

## 2023-04-29 ENCOUNTER — Other Ambulatory Visit: Payer: Self-pay

## 2023-05-03 ENCOUNTER — Other Ambulatory Visit: Payer: Self-pay

## 2023-05-11 ENCOUNTER — Other Ambulatory Visit: Payer: Self-pay

## 2023-05-17 ENCOUNTER — Other Ambulatory Visit: Payer: Self-pay

## 2023-05-27 ENCOUNTER — Other Ambulatory Visit: Payer: Self-pay

## 2023-05-31 ENCOUNTER — Other Ambulatory Visit: Payer: Self-pay

## 2023-06-01 ENCOUNTER — Telehealth (INDEPENDENT_AMBULATORY_CARE_PROVIDER_SITE_OTHER): Payer: MEDICAID | Admitting: Physician Assistant

## 2023-06-01 DIAGNOSIS — F5104 Psychophysiologic insomnia: Secondary | ICD-10-CM

## 2023-06-01 DIAGNOSIS — F1020 Alcohol dependence, uncomplicated: Secondary | ICD-10-CM

## 2023-06-01 DIAGNOSIS — F331 Major depressive disorder, recurrent, moderate: Secondary | ICD-10-CM

## 2023-06-01 DIAGNOSIS — F902 Attention-deficit hyperactivity disorder, combined type: Secondary | ICD-10-CM | POA: Diagnosis not present

## 2023-06-01 DIAGNOSIS — F411 Generalized anxiety disorder: Secondary | ICD-10-CM

## 2023-06-02 ENCOUNTER — Other Ambulatory Visit: Payer: Self-pay

## 2023-06-02 ENCOUNTER — Encounter (HOSPITAL_COMMUNITY): Payer: Self-pay | Admitting: Physician Assistant

## 2023-06-02 MED ORDER — BUSPIRONE HCL 7.5 MG PO TABS
7.5000 mg | ORAL_TABLET | Freq: Three times a day (TID) | ORAL | 2 refills | Status: DC
  Filled 2023-06-02 – 2023-06-10 (×2): qty 90, 30d supply, fill #0
  Filled 2023-07-09: qty 90, 30d supply, fill #1

## 2023-06-02 MED ORDER — BUPROPION HCL ER (XL) 300 MG PO TB24
300.0000 mg | ORAL_TABLET | ORAL | 2 refills | Status: DC
  Filled 2023-06-02 – 2023-06-14 (×2): qty 30, 30d supply, fill #0
  Filled 2023-07-13: qty 30, 30d supply, fill #1

## 2023-06-02 MED ORDER — NALTREXONE HCL 50 MG PO TABS
50.0000 mg | ORAL_TABLET | Freq: Every day | ORAL | 2 refills | Status: DC
  Filled 2023-06-02 – 2023-06-21 (×2): qty 30, 30d supply, fill #0
  Filled 2023-07-21: qty 30, 30d supply, fill #1

## 2023-06-02 MED ORDER — CLONIDINE HCL 0.1 MG PO TABS
0.0500 mg | ORAL_TABLET | Freq: Three times a day (TID) | ORAL | 2 refills | Status: DC | PRN
  Filled 2023-06-02: qty 60, 20d supply, fill #0
  Filled 2023-07-09: qty 60, 20d supply, fill #1

## 2023-06-02 MED ORDER — MELATONIN 10 MG PO TABS
10.0000 mg | ORAL_TABLET | Freq: Every evening | ORAL | 2 refills | Status: DC | PRN
  Filled 2023-06-02: qty 30, 30d supply, fill #0
  Filled 2023-07-02: qty 30, 30d supply, fill #1
  Filled 2023-08-01: qty 30, 30d supply, fill #2

## 2023-06-02 MED ORDER — ATOMOXETINE HCL 40 MG PO CAPS
40.0000 mg | ORAL_CAPSULE | Freq: Every day | ORAL | 2 refills | Status: DC
  Filled 2023-06-02 – 2023-06-28 (×2): qty 30, 30d supply, fill #0
  Filled 2023-07-29: qty 30, 30d supply, fill #1

## 2023-06-02 NOTE — Progress Notes (Signed)
BH MD/PA/NP OP Progress Note  Virtual Visit via Video Note  I connected with Patrick Alexander on 06/01/23 at  1:30 PM EST by a video enabled telemedicine application and verified that I am speaking with the correct person using two identifiers.  Location: Patient: Home Provider: Clinic   I discussed the limitations of evaluation and management by telemedicine and the availability of in person appointments. The patient expressed understanding and agreed to proceed.  Follow Up Instructions:  I discussed the assessment and treatment plan with the patient. The patient was provided an opportunity to ask questions and all were answered. The patient agreed with the plan and demonstrated an understanding of the instructions.   The patient was advised to call back or seek an in-person evaluation if the symptoms worsen or if the condition fails to improve as anticipated.  I provided 13 minutes of non-face-to-face time during this encounter.  Meta Hatchet, PA    06/01/2023 1:30 PM Patrick Alexander  MRN:  [id number]  Chief Complaint:  Chief Complaint  Patient presents with   Follow-up   Medication Refill   HPI:   Patrick Alexander is a 38 year old, Caucasian male with a past psychiatric history significant for attention deficit hyperactivity disorder (combined type), alcohol use disorder (severe/dependence, in early remission), generalized anxiety disorder, chronic PTSD, and major depressive disorder who presents to [facility] via virtual video visit for follow-up and medication management.  Patient is currently being managed on the following psychiatric medications:  Strattera 40 mg daily Naltrexone 50 mg daily Bupropion (Wellbutrin XL) 300 mg 24-hour tablet daily Clonidine 0.1 mg (0.5 to 1 tablet) by mouth 3 times daily as needed Melatonin 10 mg at bedtime as needed Buspirone 7.5 mg 3 times daily  Patient presents to the encounter feeling less  anxious.  He reports that everything seems more chilled out.  Patient reports that he has been taking his medications regularly.  He reports that BuSpar has been helpful in managing his anxiety.  He rates his anxiety at 2-3 out of 10.  Patient denies any new stressors stating that this past month has been chill.  Patient denies depression stating that his symptoms are at bay and manageable.  Patient also endorses improved communication and has been able to set up healthy boundaries.  A GAD-7 screen was performed the patient scoring a 4.  Patient is alert and oriented x 4, pleasant, calm, cooperative, and fully engaged in conversation during the encounter.  Patient endorses relaxed mood.  Patient exhibits euthymic mood with appropriate affect.  Patient denies suicidal or homicidal ideations.  He further denies auditory or visual ruminations and does not appear to be responding to internal/external stimuli.  Patient endorses good sleep and receives on average 7 to 8 hours of sleep per night.  Patient endorses good appetite and eats on average 3 meals per day.  Patient denies alcohol consumption or illicit drug use.  Patient endorses tobacco use but states that he has decreased the amount he smokes considerably.  Visit Diagnosis:    ICD-10-CM   1. Generalized anxiety disorder  F41.1 busPIRone (BUSPAR) 7.5 MG tablet    cloNIDine (CATAPRES) 0.1 MG tablet    2. Attention deficit hyperactivity disorder (ADHD), combined type  F90.2 atomoxetine (STRATTERA) 40 MG capsule    buPROPion (WELLBUTRIN XL) 300 MG 24 hr tablet    cloNIDine (CATAPRES) 0.1 MG tablet    3. Moderate episode of recurrent major depressive disorder (HCC)  F33.1 buPROPion (  WELLBUTRIN XL) 300 MG 24 hr tablet    4. Alcohol use disorder, severe, dependence (HCC)  F10.20 naltrexone (DEPADE) 50 MG tablet    5. Psychophysiological insomnia  F51.04 Melatonin 10 MG TABS       Past Psychiatric History:  Patient has a past [name] history  significant for attention deficit hyperactivity disorder, major depressive disorder, PTSD, polysubstance abuse, and generalized anxiety disorder   Patient reports that he has a past history of hospitalization due to mental health.  Patient reports that the hospitalization occurred around the time he was 38 when he had moved to [place].  Patient reports that he is originally from a [address] town and once he moved to [place], he wanted to leave and by experimenting with drugs.  Patient states that he grew up sheltered and once he moved to [place], he states that he rebelled against the environment he grew up in.   Patient denies a past history of suicide attempt but states that he has had suicidal thoughts in the past   Patient denies a past history of homicide  Past Medical History:  Past Medical History:  Diagnosis Date   ADD (attention deficit disorder)    Allergy    Anxiety    Depression    Hemorrhoids 02/03/2015   High risk sexual behavior 02/03/2015   History of admission to inpatient psychiatry department 03/09/2022   History of non-suicidal self-harm 03/09/2022   Hypertension    Sleep apnea    Sleep disorder 06/02/2015   Substance abuse (HCC)    Tobacco use disorder 06/02/2015    Past Surgical History:  Procedure Laterality Date   TONSILLECTOMY     TYMPANOSTOMY TUBE PLACEMENT     WISDOM TOOTH EXTRACTION      Family Psychiatric History:  Patient reports that mental health runs on his father's side of the family.  Patient reports that PTSD and generalized anxiety are common on his father's side of the family   Father - Generalized anxiety disorder and major depressive disorder Grandfather (paternal) - Generalized anxiety disorder and major depressive disorder.  Patient reports that his grandfather had a past psychiatric history of paranoid schizophrenia and spent extended periods of time in the hospital   Family history of suicide attempt: Patient denies Family  history of homicide: Patient denies Family history of substance abuse: Patient reports that his cousins on his mother's side of the family all struggle with substance abuse  Family History:  Family History  Problem Relation Age of Onset   Allergies Mother    Hyperlipidemia Mother    Diabetes Mother    Cancer Mother        Endometrial   Arthritis Mother    Varicose Veins Mother    Allergies Father    Hypertension Father    Stroke Father    Depression Father    Hyperlipidemia Father    Anxiety disorder Father    Arthritis Father    Cancer Father        Skin, lymphoma   Heart disease Father    Vision loss Father    Allergies Sister    Hyperlipidemia Sister    ADD / ADHD Sister    Varicose Veins Sister    Alcohol abuse Maternal Aunt    Cancer Maternal Aunt        Skin   Miscarriages / Stillbirths Maternal Aunt    Arthritis Maternal Grandmother    Cancer Maternal Grandmother        Glioblastoma  Cancer Maternal Grandfather        Prostate   Cancer Paternal Grandmother        Breast   Dementia Paternal Grandmother    Anxiety disorder Paternal Grandfather    Depression Paternal Grandfather    Heart disease Paternal Grandfather    Hypertension Paternal Grandfather    Schizophrenia Paternal Grandfather     Social History:  Social History   Socioeconomic History   Marital status: Single    Spouse name: Not on file   Number of children: 0   Years of education: Not on file   Highest education level: Associate degree: occupational, Scientist, product/process development, or vocational program  Occupational History   Occupation: Self-employed    Comment: Variety of jobs when available.  Tobacco Use   Smoking status: Every Day    Current packs/day: 0.00    Average packs/day: 0.3 packs/day for 17.0 years (4.3 ttl pk-yrs)    Types: Cigarettes    Last attempt to quit: 01/30/2015    Years since quitting: 8.3   Smokeless tobacco: Not on file   Tobacco comments:    About 2/day  Vaping Use    Vaping status: Never Used  Substance and Sexual Activity   Alcohol use: Not Currently   Drug use: Not Currently    Types: Amphetamines, Benzodiazepines, Cocaine, Codeine, Hydrocodone, Marijuana, MDMA (Ecstacy), Methylphenidate, Oxycodone, Psilocybin   Sexual activity: Not Currently    Birth control/protection: None  Other Topics Concern   Not on file  Social History Narrative   Not on file   Social Drivers of Health   Financial Resource Strain: Medium Risk (02/06/2023)   Overall Financial Resource Strain (CARDIA)    Difficulty of Paying Living Expenses: Somewhat hard  Food Insecurity: Food Insecurity Present (02/06/2023)   Hunger Vital Sign    Worried About Running Out of Food in the Last Year: Never true    Ran Out of Food in the Last Year: Sometimes true  Transportation Needs: Unmet Transportation Needs (02/06/2023)   PRAPARE - Transportation    Lack of Transportation (Medical): No    Lack of Transportation (Non-Medical): Yes  Physical Activity: Sufficiently Active (02/06/2023)   Exercise Vital Sign    Days of Exercise per Week: 7 days    Minutes of Exercise per Session: 130 min  Stress: No Stress Concern Present (02/06/2023)   [employer] of Occupational Health - Occupational Stress Questionnaire    Feeling of Stress : Not at all  Social Connections: Moderately Integrated (02/06/2023)   Social Connection and Isolation Panel [NHANES]    Frequency of Communication with Friends and Family: Three times a week    Frequency of Social Gatherings with Friends and Family: Twice a week    Attends Religious Services: More than 4 times per year    Active Member of [employer] or Organizations: Yes    Attends Engineer, structural: More than 4 times per year    Marital Status: Never married    Allergies:  Allergies  Allergen Reactions   Amoxicillin Nausea And Vomiting   Penicillins Nausea And Vomiting    Metabolic Disorder Labs: Lab Results  Component Value Date    HGBA1C 5.3 03/09/2022   MPG 105.41 03/09/2022   No results found for: "PROLACTIN" Lab Results  Component Value Date   CHOL 192 03/09/2022   TRIG 104 03/09/2022   HDL 73 03/09/2022   CHOLHDL 2.6 03/09/2022   VLDL 21 03/09/2022   LDLCALC 98 03/09/2022   LDLCALC 120 04/23/2015  Lab Results  Component Value Date   TSH 2.539 03/09/2022    Therapeutic Level Labs: No results found for: "LITHIUM" No results found for: "VALPROATE" No results found for: "CBMZ"  Current Medications: Current Outpatient Medications  Medication Sig Dispense Refill   acetaminophen (TYLENOL) 325 MG tablet Take 650 mg by mouth every 6 (six) hours as needed.     atomoxetine (STRATTERA) 40 MG capsule Take 1 capsule (40 mg total) by mouth daily. 30 capsule 2   buPROPion (WELLBUTRIN XL) 300 MG 24 hr tablet Take 1 tablet (300 mg total) by mouth every morning. 30 tablet 2   busPIRone (BUSPAR) 7.5 MG tablet Take 1 tablet (7.5 mg total) by mouth 3 (three) times daily. 90 tablet 2   cetirizine (ZYRTEC ALLERGY) 10 MG tablet Take 1 tablet (10 mg total) by mouth daily. 30 tablet 1   cloNIDine (CATAPRES) 0.1 MG tablet Take 0.5-1 tablets (0.05-0.1 mg total) by mouth 3 (three) times daily as needed.Not to exceed 2 full tablets per day. 60 tablet 2   loratadine (CLARITIN) 10 MG tablet Take 10 mg by mouth daily as needed for allergies.     Melatonin 10 MG TABS Take 10 mg by mouth at bedtime as needed. 30 tablet 2   naltrexone (DEPADE) 50 MG tablet Take 1 tablet (50 mg total) by mouth at bedtime. 30 tablet 2   No current facility-administered medications for this visit.     Musculoskeletal: Strength & Muscle Tone: within normal limits Gait & Station: normal Patient leans: N/A  Psychiatric Specialty Exam: Review of Systems  Psychiatric/Behavioral:  Negative for decreased concentration, dysphoric mood, hallucinations, self-injury, sleep disturbance and suicidal ideas. The patient is not nervous/anxious and is not  hyperactive.     There were no vitals taken for this visit.There is no height or weight on file to calculate BMI.  General Appearance: Casual  Eye Contact:  Good  Speech:  Clear and Coherent and Normal Rate  Volume:  Normal  Mood:  Euthymic  Affect:  Appropriate  Thought Process:  Coherent, Goal Directed, and Descriptions of Associations: Intact  Orientation:  Full (Time, Place, and Person)  Thought Content: WDL   Suicidal Thoughts:  No  Homicidal Thoughts:  No  Memory:  Immediate;   Good Recent;   Good Remote;   Good  Judgement:  Good  Insight:  Good  Psychomotor Activity:  Normal  Concentration:  Concentration: Good and Attention Span: Good  Recall:  Good  Fund of Knowledge: Good  Language: Good  Akathisia:  No  Handed:  Right  AIMS (if indicated): not done  Assets:  Communication Skills Desire for Improvement Housing Resilience Social Support Talents/Skills Vocational/Educational  ADL's:  Intact  Cognition: WNL  Sleep:  Good   Screenings: GAD-7    Flowsheet Row Video Visit from 06/01/2023 in [facility] Clinical Support from 04/06/2023 in [facility] Clinical Support from 02/23/2023 in [facility] Office Visit from 02/07/2023 in [facility] Visit from 01/05/2023 in [facility]  Total GAD-7 Score 4 8 14 [phone] 9       [id number]    Flowsheet Row Video Visit from 06/01/2023 in [facility] Clinical Support from 04/06/2023 in [facility] Clinical Support from 02/23/2023 in [facility] Office Visit from 02/07/2023 in [facility] Visit from 01/05/2023  in [facility] Total Score 1 1 3 1 3   PHQ-9 Total Score -- -- 5 2 5       Flowsheet  Row Video Visit from 06/01/2023 in [facility] Clinical Support from 04/06/2023 in [facility] Clinical Support from 02/23/2023 in [facility]  C-SSRS RISK CATEGORY Moderate Risk Moderate Risk Moderate Risk        Assessment and Plan:   Donavon Kimrey is a 38 year old, Caucasian male with a past psychiatric history significant for attention deficit hyperactivity disorder (combined type), alcohol use disorder (severe/dependence, in early remission), generalized anxiety disorder, chronic PTSD, and major depressive disorder who presents to [facility] via virtual video visit for follow-up and medication management.  Patient presents to the encounter stating that he has been taking his medications regularly.  Patient reports that he feels less anxious and states that his BuSpar has been extremely helpful in managing his anxiety.  Patient denies overt depressive symptoms at this time.  Patient endorses stability on his current medication regimen and would like to continue taking his medications as prescribed.  Patient's medications to be e-prescribed to pharmacy of choice.  Collaboration of Care: Collaboration of Care: Medication Management AEB provider managing patient's psychiatric medications and Psychiatrist AEB patient being followed by mental health provider at this facility  Patient/Guardian was advised Release of Information must be obtained prior to any record release in order to collaborate their care with an outside provider. Patient/Guardian was advised if they have not already done so to contact the registration department to sign all necessary forms in order for [place] to release information regarding their care.   Consent: Patient/Guardian gives verbal consent for treatment and assignment of benefits for services provided during this visit. Patient/Guardian expressed  understanding and agreed to proceed.   1. Generalized anxiety disorder  - busPIRone (BUSPAR) 7.5 MG tablet; Take 1 tablet (7.5 mg total) by mouth 3 (three) times daily.  Dispense: 90 tablet; Refill: 2 - cloNIDine (CATAPRES) 0.1 MG tablet; Take 0.5-1 tablets (0.05-0.1 mg total) by mouth 3 (three) times daily as needed.Not to exceed 2 full tablets per day.  Dispense: 60 tablet; Refill: 2  2. Attention deficit hyperactivity disorder (ADHD), combined type  - atomoxetine (STRATTERA) 40 MG capsule; Take 1 capsule (40 mg total) by mouth daily.  Dispense: 30 capsule; Refill: 2 - buPROPion (WELLBUTRIN XL) 300 MG 24 hr tablet; Take 1 tablet (300 mg total) by mouth every morning.  Dispense: 30 tablet; Refill: 2 - cloNIDine (CATAPRES) 0.1 MG tablet; Take 0.5-1 tablets (0.05-0.1 mg total) by mouth 3 (three) times daily as needed.Not to exceed 2 full tablets per day.  Dispense: 60 tablet; Refill: 2  3. Moderate episode of recurrent major depressive disorder (HCC)  - buPROPion (WELLBUTRIN XL) 300 MG 24 hr tablet; Take 1 tablet (300 mg total) by mouth every morning.  Dispense: 30 tablet; Refill: 2  4. Alcohol use disorder, severe, dependence (HCC)  - naltrexone (DEPADE) 50 MG tablet; Take 1 tablet (50 mg total) by mouth at bedtime.  Dispense: 30 tablet; Refill: 2  5. Psychophysiological insomnia  - Melatonin 10 MG TABS; Take 10 mg by mouth at bedtime as needed.  Dispense: 30 tablet; Refill: 2  Patient to follow-up in 2 months Provider spent a total of 13 minutes with the patient/reviewing patient's chart  Meta Hatchet, PA 06/01/2023, 1:30 PM

## 2023-06-03 ENCOUNTER — Other Ambulatory Visit: Payer: Self-pay

## 2023-06-10 ENCOUNTER — Other Ambulatory Visit: Payer: Self-pay

## 2023-06-13 ENCOUNTER — Other Ambulatory Visit: Payer: Self-pay

## 2023-06-14 ENCOUNTER — Other Ambulatory Visit: Payer: Self-pay

## 2023-06-16 ENCOUNTER — Other Ambulatory Visit: Payer: Self-pay

## 2023-06-21 ENCOUNTER — Other Ambulatory Visit: Payer: Self-pay

## 2023-06-22 ENCOUNTER — Other Ambulatory Visit: Payer: Self-pay

## 2023-06-28 ENCOUNTER — Other Ambulatory Visit: Payer: Self-pay

## 2023-07-04 ENCOUNTER — Ambulatory Visit (INDEPENDENT_AMBULATORY_CARE_PROVIDER_SITE_OTHER): Payer: MEDICAID | Admitting: Clinical

## 2023-07-04 ENCOUNTER — Encounter (HOSPITAL_COMMUNITY): Payer: Self-pay

## 2023-07-04 ENCOUNTER — Other Ambulatory Visit: Payer: Self-pay

## 2023-07-04 DIAGNOSIS — F331 Major depressive disorder, recurrent, moderate: Secondary | ICD-10-CM

## 2023-07-04 DIAGNOSIS — F1021 Alcohol dependence, in remission: Secondary | ICD-10-CM

## 2023-07-04 NOTE — Progress Notes (Unsigned)
 Comprehensive Clinical Assessment (CCA) Note  07/04/2023 [name] [id number]  Chief Complaint:  Chief Complaint  Patient presents with   ADHD   Depression   Anxiety   Visit Diagnosis:   MDD, recurrent episode, moderate with anxious distress Alcohol use disorder, severe, early remission   Individual Therapy:   Client is a 38 year old male presenting to the [facility] health center to establish outpatient care. Client is currently followed by a [facility] psychiatrist for the treatment of GAD, MDD, ADHD and PTSD. Client reported his medication regimen is working well. Client reported by history following a break up with his fiance' and and a friend he went into a episode of drinking, partying and sex within 6 months in 2018 approx. Client reported also during that time of going into COVID his dad had cancer and his sister had a baby whom he helped care for which was stressful for him. Client reported he went to detox last November then went to [facility] recovery for a month and a half for alcohol treatment. Client reported prior to that it had been 5 or 6 years since being in treatment.  Client reported he has been sober from alcohol since November 2024. Client reported he is connected to AA meetings and family that helps support him. Client reported he is now working on coping with stressors and regulating his emotions of depression and anxiety. Client reported he is sleeping well and eating well. Client presented oriented times five, appropriately dressed and friendly. Client denied hallucinations, delusions, suicidal and homicidal ideations. Client was screened for pain, nutrition, [place] suicide severity and the following SDOH:     07/04/2023    9:31 AM 06/01/2023    1:41 PM 04/06/2023   11:54 AM 02/23/2023   11:23 AM  GAD 7 : Generalized Anxiety Score  Nervous, Anxious, on Edge 1 1 1 2   Control/stop worrying 1 1 2 2   Worry too much - different things 1 1 1 2   Trouble  relaxing 1 0 0 2  Restless 0 1 1 2   Easily annoyed or irritable 0 0 1 3  Afraid - awful might happen 0 0 2 1  Total GAD 7 Score 4 4 8 14   Anxiety Difficulty Somewhat difficult Not difficult at all Somewhat difficult Somewhat difficult     Flowsheet Row Counselor from 07/04/2023 in [facility]  PHQ-9 Total Score 2       Treatment recommendations: therapy and psychiatry   Therapist provided information on format of appointment (virtual or face to face).   The client was advised to call back or seek an in-person evaluation if the symptoms worsen or if the condition fails to improve as anticipated before the next scheduled appointment. Client was in agreement with treatment recommendations.   CCA Biopsychosocial Intake/Chief Complaint:  client reported he has been seeing a [facility] psychiatriatrist which recommened him for therapy. client reported he is being seen for MDD, GAD, ADHD and PTSD. client reported he is managing well on his medication regimen.  Current Symptoms/Problems: client reported symptoms of worrying, feeling down, mood swings  Patient Reported Schizophrenia/Schizoaffective Diagnosis in Past: No  Strengths: voluntarily engaging in services  Preferences: counseling and medication management  Abilities: discuss history of symptoms and goals  Type of Services Patient Feels are Needed: psychiatry and individual counseling  Initial Clinical Notes/Concerns: No data recorded  Mental Health Symptoms Depression:  Change in energy/activity   Duration of Depressive symptoms: Greater than two weeks  Mania:  None   Anxiety:   Difficulty concentrating; Tension   Psychosis:  None   Duration of Psychotic symptoms: No data recorded  Trauma:  Irritability/anger   Obsessions:  None   Compulsions:  None   Inattention:  None   Hyperactivity/Impulsivity:  None   Oppositional/Defiant Behaviors:  None   Emotional Irregularity:  None    Other Mood/Personality Symptoms:  No data recorded   Mental Status Exam Appearance and self-care  Stature:  Average   Weight:  Average weight   Clothing:  Casual   Grooming:  Normal   Cosmetic use:  Age appropriate   Posture/gait:  Normal   Motor activity:  Not Remarkable   Sensorium  Attention:  Normal   Concentration:  Normal   Orientation:  X5   Recall/memory:  Normal   Affect and Mood  Affect:  Congruent   Mood:  Euthymic   Relating  Eye contact:  Normal   Facial expression:  Responsive   Attitude toward examiner:  Cooperative   Thought and Language  Speech flow: Clear and Coherent   Thought content:  Appropriate to Mood and Circumstances   Preoccupation:  None   Hallucinations:  None   Organization:  No data recorded  [employer] of Knowledge:  Good   Intelligence:  Average   Abstraction:  Normal   Judgement:  Good   Reality Testing:  Adequate   Insight:  Good   Decision Making:  Normal   Social Functioning  Social Maturity:  Responsible   Social Judgement:  Normal   Stress  Stressors:  Transitions   Coping Ability:  Set designer Deficits:  Self-care; Communication   Supports:  Family     Religion: Religion/Spirituality Are You A Religious Person?: Yes  Leisure/Recreation: Leisure / Recreation Do You Have Hobbies?: No  Exercise/Diet: Exercise/Diet Do You Exercise?: No Have You Gained or Lost A Significant Amount of Weight in the Past Six Months?: No Do You Follow a Special Diet?: No Do You Have Any Trouble Sleeping?: No (client reported he is sleeping much better with medication. 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 client reported he was raised by both parents. client reported his childhood was good, they were sheltered. Patient's description of current relationship with people who raised him/her: client reported he has a good relationship is good with his parents. Does patient have siblings?: Yes Number of Siblings: 1 Description of patient's current relationship with siblings: client reported he has a older sister,they are really close in relationship. Did patient suffer any verbal/emotional/physical/sexual abuse as a child?: No Did patient suffer from severe childhood neglect?: No Has patient ever been sexually abused/assaulted/raped as an adolescent or adult?: No Was the patient ever a victim of a crime or a disaster?: No Witnessed domestic violence?: No Has patient been affected by domestic violence as an adult?: No  Child/Adolescent Assessment:  CCA Substance Use Alcohol/Drug Use: Alcohol / Drug Use History of alcohol / drug use?: No history of alcohol / drug abuse                         ASAM's:  Six Dimensions of Multidimensional Assessment  Dimension 1:  Acute Intoxication and/or Withdrawal Potential:      Dimension 2:  Biomedical Conditions and Complications:      Dimension 3:  Emotional, Behavioral, or Cognitive Conditions and Complications:     Dimension 4:  Readiness to Change:     Dimension 5:  Relapse, Continued use, or Continued Problem Potential:     Dimension 6:  Recovery/Living Environment:     ASAM Severity Score:    ASAM Recommended Level  of Treatment:     Substance use Disorder (SUD)    Recommendations for Services/Supports/Treatments: Recommendations for Services/Supports/Treatments Recommendations For Services/Supports/Treatments: Medication Management, Individual Therapy  DSM5 Diagnoses: Patient Active Problem List   Diagnosis Date Noted   History of syphilis 02/07/2023   GAD (generalized anxiety disorder) 04/22/2022   Psychophysiological insomnia 03/30/2022   Marijuana abuse 03/09/2022   PTSD (post-traumatic stress disorder) 03/09/2022   Eating disorder, unspecified 03/09/2022   MDD (major depressive disorder), severe (HCC) 03/09/2022   Attention deficit hyperactivity disorder (ADHD) 03/09/2022   History of admission to inpatient psychiatry department 03/09/2022   Alcohol use disorder, severe, in sustained remission, dependence (HCC) 03/08/2022   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   Seasonal allergic rhinitis due to pollen 08/19/2015   Positive ANA (antinuclear antibody) 08/19/2015   OSA on CPAP 08/19/2015   Tobacco use disorder 06/02/2015   Obsessive-compulsive disorder, unspecified 08/05/2012    Patient Centered Plan: Patient is on the following Treatment Plan(s):  Depression   Referrals to Alternative Service(s): Referred to Alternative Service(s):   Place:   Date:   Time:    Referred to Alternative Service(s):   Place:   Date:   Time:    Referred to Alternative Service(s):   Place:   Date:   Time:    Referred to Alternative Service(s):   Place:   Date:   Time:      Collaboration of Care: Referral or follow-up with counselor/therapist [facility]  Patient/Guardian was advised Release of Information must be obtained prior to any record release in order to collaborate their care with an outside provider. Patient/Guardian was advised if they have not already done so to contact the registration department to sign all necessary forms in order for [place] to release information regarding their care.    Consent: Patient/Guardian gives verbal consent for treatment and assignment of benefits for services provided during this visit. Patient/Guardian expressed understanding and agreed to proceed.   Neena Rhymes Jossue Rubenstein, LCSW

## 2023-07-13 ENCOUNTER — Other Ambulatory Visit: Payer: Self-pay

## 2023-07-22 ENCOUNTER — Other Ambulatory Visit: Payer: Self-pay

## 2023-08-01 ENCOUNTER — Other Ambulatory Visit: Payer: Self-pay

## 2023-08-03 ENCOUNTER — Encounter (HOSPITAL_COMMUNITY): Payer: Self-pay | Admitting: Physician Assistant

## 2023-08-03 ENCOUNTER — Other Ambulatory Visit: Payer: Self-pay

## 2023-08-03 ENCOUNTER — Ambulatory Visit (HOSPITAL_COMMUNITY): Payer: MEDICAID | Admitting: Physician Assistant

## 2023-08-03 DIAGNOSIS — F411 Generalized anxiety disorder: Secondary | ICD-10-CM | POA: Diagnosis not present

## 2023-08-03 DIAGNOSIS — F5104 Psychophysiologic insomnia: Secondary | ICD-10-CM

## 2023-08-03 DIAGNOSIS — F331 Major depressive disorder, recurrent, moderate: Secondary | ICD-10-CM

## 2023-08-03 DIAGNOSIS — F1020 Alcohol dependence, uncomplicated: Secondary | ICD-10-CM | POA: Diagnosis not present

## 2023-08-03 DIAGNOSIS — F902 Attention-deficit hyperactivity disorder, combined type: Secondary | ICD-10-CM

## 2023-08-03 MED ORDER — CLONIDINE HCL 0.1 MG PO TABS
0.0500 mg | ORAL_TABLET | Freq: Three times a day (TID) | ORAL | 2 refills | Status: DC | PRN
  Filled 2023-08-03: qty 60, 20d supply, fill #0
  Filled 2023-08-17: qty 60, 30d supply, fill #0

## 2023-08-03 MED ORDER — MELATONIN 10 MG PO TABS
10.0000 mg | ORAL_TABLET | Freq: Every evening | ORAL | 2 refills | Status: DC | PRN
  Filled 2023-08-03: qty 30, 30d supply, fill #0

## 2023-08-03 MED ORDER — NALTREXONE HCL 50 MG PO TABS
50.0000 mg | ORAL_TABLET | Freq: Every day | ORAL | 2 refills | Status: DC
  Filled 2023-08-03 – 2023-09-10 (×2): qty 30, 30d supply, fill #0
  Filled 2023-10-10 – 2023-10-14 (×2): qty 30, 30d supply, fill #1
  Filled 2023-11-10: qty 30, 30d supply, fill #2

## 2023-08-03 MED ORDER — BUPROPION HCL ER (XL) 300 MG PO TB24
300.0000 mg | ORAL_TABLET | ORAL | 2 refills | Status: DC
  Filled 2023-08-03 – 2023-08-10 (×2): qty 30, 30d supply, fill #0
  Filled 2023-09-10: qty 30, 30d supply, fill #1

## 2023-08-03 MED ORDER — ATOMOXETINE HCL 40 MG PO CAPS
40.0000 mg | ORAL_CAPSULE | Freq: Two times a day (BID) | ORAL | 2 refills | Status: DC
  Filled 2023-08-03 – 2023-08-15 (×9): qty 60, 30d supply, fill #0
  Filled 2023-09-10: qty 60, 30d supply, fill #1

## 2023-08-03 MED ORDER — BUSPIRONE HCL 7.5 MG PO TABS
7.5000 mg | ORAL_TABLET | Freq: Two times a day (BID) | ORAL | 2 refills | Status: DC
Start: 2023-08-03 — End: 2023-09-29
  Filled 2023-08-03 – 2023-08-08 (×3): qty 60, 30d supply, fill #0
  Filled 2023-09-10: qty 60, 30d supply, fill #1

## 2023-08-03 NOTE — Progress Notes (Signed)
 [facility] MD/PA/NP OP Progress Note  08/03/2023 3:25 PM Patrick Alexander  MRN:  [id number]  Chief Complaint:  Chief Complaint  Patient presents with   Follow-up   Medication Management   HPI:   Patrick Alexander is a 38 year old, Caucasian male with a past psychiatric history significant for attention deficit hyperactivity disorder (combined type), alcohol  use disorder (severe/dependence, in early remission), generalized anxiety disorder, chronic PTSD, and major depressive disorder who presents to [facility] for follow-up and medication management.  Patient is currently being managed on the following psychiatric medications:  Strattera  40 mg daily Naltrexone  50 mg daily Bupropion  (Wellbutrin  XL) 300 mg 24-hour tablet daily Clonidine  0.1 mg (0.5 to 1 tablet) by mouth 3 times daily as needed Melatonin 10 mg at bedtime as needed Buspirone  7.5 mg 3 times daily  Patient presents to the encounter stating that things have been going well for him; however, he does report that his current living situation may be affected by whether or not his father obtains a new job.  Patient reports that his father is currently working at [employer] and the patient is currently living in a house that is attached to the chart.  He reports that if his father decides to go to another church for work, then he will lose his current living space.  Patient reports that he continues to take his medications regularly and denies experiencing any adverse side effects.  He does report that he will occasionally take an additional dose of Strattera  for the management of his inattentiveness and lack of concentration.  He reports that he does not experience feeling high strung or jacked while taking the additional dose and is able to get things done.  Patient also informed provider that he is no longer taking his midday dose of clonidine .  Patient also informed provider that he is no longer taking his  afternoon dose of buspirone  stating that his morning and evening dose of buspirone  have been managing his anxiety.  Patient states that he experiences depression when hit with unexpected news.  He reports that when he does experience depression, he is able to talk through what is bothering him.  Patient reports that when he is working, he is generally in a happy mood.  He does occasionally endorse anxiety attacks on occasion but has learned to walk through his attacks.  Patient states that his anxiety is mostly elevated when out in public but believes that he is fabricating the severity of his anxiety.  A PHQ-9 screen was performed with the patient scoring a 6.  A GAD-7 screen was also performed the patient scored a 10.  Patient is alert and oriented x 4, pleasant, calm, cooperative, and fully engaged in conversation during the encounter.  Patient endorses good mood stating that he feels accomplished.  Patient exhibits euthymic mood with appropriate affect.  Patient denies suicidal or homicidal ideations.  He further denies auditory or visual hallucinations and does not appear to be responding to internal/external stimuli.  Patient endorses good sleep and receives on average 7 to 8 hours of sleep per night.  Patient endorses good appetite and eats on average 3 meals per day.  Patient denies alcohol  consumption or illicit drug use.  The patient endorses tobacco use and smokes on average 1 to 2 cigarettes/day.  Visit Diagnosis:    ICD-10-CM   1. Attention deficit hyperactivity disorder (ADHD), combined type  F90.2 atomoxetine  (STRATTERA ) 40 MG capsule    cloNIDine  (CATAPRES ) 0.1 MG  tablet    buPROPion  (WELLBUTRIN  XL) 300 MG 24 hr tablet    2. Generalized anxiety disorder  F41.1 busPIRone  (BUSPAR ) 7.5 MG tablet    cloNIDine  (CATAPRES ) 0.1 MG tablet    3. Alcohol  use disorder, severe, dependence (HCC)  F10.20 naltrexone  (DEPADE) 50 MG tablet    4. Moderate episode of recurrent major depressive disorder  (HCC)  F33.1 buPROPion  (WELLBUTRIN  XL) 300 MG 24 hr tablet    5. Psychophysiological insomnia  F51.04 Melatonin 10 MG TABS       Past Psychiatric History:  Patient has a past Portia Brittle history significant for attention deficit hyperactivity disorder, major depressive disorder, PTSD, polysubstance abuse, and generalized anxiety disorder   Patient reports that he has a past history of hospitalization due to mental health.  Patient reports that the hospitalization occurred around the time he was 27 when he had moved to [place].  Patient reports that he is originally from a [address] town and once he moved to [place], he wanted to leave and by experimenting with drugs.  Patient states that he grew up sheltered and once he moved to [place], he states that he rebelled against the environment he grew up in.   Patient denies a past history of suicide attempt but states that he has had suicidal thoughts in the past   Patient denies a past history of homicide  Past Medical History:  Past Medical History:  Diagnosis Date   ADD (attention deficit disorder)    Allergy    Anxiety    Depression    Hemorrhoids 02/03/2015   High risk sexual behavior 02/03/2015   History of admission to inpatient psychiatry department 03/09/2022   History of non-suicidal self-harm 03/09/2022   Hypertension    Sleep apnea    Sleep disorder 06/02/2015   Substance abuse (HCC)    Tobacco use disorder 06/02/2015    Past Surgical History:  Procedure Laterality Date   TONSILLECTOMY     TYMPANOSTOMY TUBE PLACEMENT     WISDOM TOOTH EXTRACTION      Family Psychiatric History:  Patient reports that mental health runs on his father's side of the family.  Patient reports that PTSD and generalized anxiety are common on his father's side of the family   Father - Generalized anxiety disorder and major depressive disorder Grandfather (paternal) - Generalized anxiety disorder and major depressive disorder.  Patient  reports that his grandfather had a past psychiatric history of paranoid schizophrenia and spent extended periods of time in the hospital   Family history of suicide attempt: Patient denies Family history of homicide: Patient denies Family history of substance abuse: Patient reports that his cousins on his mother's side of the family all struggle with substance abuse  Family History:  Family History  Problem Relation Age of Onset   Allergies Mother    Hyperlipidemia Mother    Diabetes Mother    Cancer Mother        Endometrial   Arthritis Mother    Varicose Veins Mother    Allergies Father    Hypertension Father    Stroke Father    Depression Father    Hyperlipidemia Father    Anxiety disorder Father    Arthritis Father    Cancer Father        Skin, lymphoma   Heart disease Father    Vision loss Father    Allergies Sister    Hyperlipidemia Sister    ADD / ADHD Sister    Varicose Veins Sister  Alcohol  abuse Maternal Aunt    Cancer Maternal Aunt        Skin   Miscarriages / Stillbirths Maternal Aunt    Arthritis Maternal Grandmother    Cancer Maternal Grandmother        Glioblastoma   Cancer Maternal Grandfather        Prostate   Cancer Paternal Grandmother        Breast   Dementia Paternal Grandmother    Anxiety disorder Paternal Grandfather    Depression Paternal Grandfather    Heart disease Paternal Grandfather    Hypertension Paternal Grandfather    Schizophrenia Paternal Grandfather     Social History:  Social History   Socioeconomic History   Marital status: Single    Spouse name: Not on file   Number of children: 0   Years of education: Not on file   Highest education level: Associate degree: occupational, Scientist, product/process development, or vocational program  Occupational History   Occupation: Self-employed    Comment: Variety of jobs when available.  Tobacco Use   Smoking status: Every Day    Current packs/day: 0.00    Average packs/day: 0.3 packs/day for 17.0  years (4.3 ttl pk-yrs)    Types: Cigarettes    Last attempt to quit: 01/30/2015    Years since quitting: 8.5   Smokeless tobacco: Not on file   Tobacco comments:    About 2/day  Vaping Use   Vaping status: Never Used  Substance and Sexual Activity   Alcohol  use: Not Currently   Drug use: Not Currently    Types: Amphetamines, Benzodiazepines, Cocaine, Codeine, Hydrocodone, Marijuana, MDMA (Ecstacy), Methylphenidate, Oxycodone, Psilocybin   Sexual activity: Not Currently    Birth control/protection: None  Other Topics Concern   Not on file  Social History Narrative   Not on file   Social Drivers of Health   Financial Resource Strain: Medium Risk (02/06/2023)   Overall Financial Resource Strain (CARDIA)    Difficulty of Paying Living Expenses: Somewhat hard  Food Insecurity: Food Insecurity Present (02/06/2023)   Hunger Vital Sign    Worried About Running Out of Food in the Last Year: Never true    Ran Out of Food in the Last Year: Sometimes true  Transportation Needs: Unmet Transportation Needs (02/06/2023)   PRAPARE - Transportation    Lack of Transportation (Medical): No    Lack of Transportation (Non-Medical): Yes  Physical Activity: Sufficiently Active (02/06/2023)   Exercise Vital Sign    Days of Exercise per Week: 7 days    Minutes of Exercise per Session: 130 min  Stress: No Stress Concern Present (02/06/2023)   [employer] of Occupational Health - Occupational Stress Questionnaire    Feeling of Stress : Not at all  Social Connections: Moderately Integrated (02/06/2023)   Social Connection and Isolation Panel [NHANES]    Frequency of Communication with Friends and Family: Three times a week    Frequency of Social Gatherings with Friends and Family: Twice a week    Attends Religious Services: More than 4 times per year    Active Member of [employer] or Organizations: Yes    Attends Engineer, structural: More than 4 times per year    Marital Status: Never  married    Allergies:  Allergies  Allergen Reactions   Amoxicillin Nausea And Vomiting   Penicillins Nausea And Vomiting    Metabolic Disorder Labs: Lab Results  Component Value Date   HGBA1C 5.3 03/09/2022   MPG 105.41 03/09/2022  No results found for: "PROLACTIN" Lab Results  Component Value Date   CHOL 192 03/09/2022   TRIG 104 03/09/2022   HDL 73 03/09/2022   CHOLHDL 2.6 03/09/2022   VLDL 21 03/09/2022   LDLCALC 98 03/09/2022   LDLCALC 120 04/23/2015   Lab Results  Component Value Date   TSH 2.539 03/09/2022    Therapeutic Level Labs: No results found for: "LITHIUM" No results found for: "VALPROATE" No results found for: "CBMZ"  Current Medications: Current Outpatient Medications  Medication Sig Dispense Refill   acetaminophen  (TYLENOL ) 325 MG tablet Take 650 mg by mouth every 6 (six) hours as needed.     atomoxetine  (STRATTERA ) 40 MG capsule Take 1 capsule (40 mg total) by mouth 2 (two) times daily with a meal. 60 capsule 2   buPROPion  (WELLBUTRIN  XL) 300 MG 24 hr tablet Take 1 tablet (300 mg total) by mouth every morning. 30 tablet 2   busPIRone  (BUSPAR ) 7.5 MG tablet Take 1 tablet (7.5 mg total) by mouth 2 (two) times daily. 60 tablet 2   cetirizine  (ZYRTEC  ALLERGY) 10 MG tablet Take 1 tablet (10 mg total) by mouth daily. 30 tablet 1   cloNIDine  (CATAPRES ) 0.1 MG tablet Take 0.5-1 tablets (0.05-0.1 mg total) by mouth 3 (three) times daily as needed.Not to exceed 2 full tablets per day. 60 tablet 2   loratadine (CLARITIN) 10 MG tablet Take 10 mg by mouth daily as needed for allergies.     Melatonin 10 MG TABS Take 10 mg by mouth at bedtime as needed. 30 tablet 2   naltrexone  (DEPADE) 50 MG tablet Take 1 tablet (50 mg total) by mouth at bedtime. 30 tablet 2   No current facility-administered medications for this visit.     Musculoskeletal: Strength & Muscle Tone: within normal limits Gait & Station: normal Patient leans: N/A  Psychiatric Specialty  Exam: Review of Systems  Psychiatric/Behavioral:  Negative for decreased concentration, dysphoric mood, hallucinations, self-injury, sleep disturbance and suicidal ideas. The patient is not nervous/anxious and is not hyperactive.     Blood pressure (!) 146/106, pulse 77, temperature 98 F (36.7 C), temperature source Oral, height 5\' 7"  (1.702 m), weight 162 lb 9.6 oz (73.8 kg), SpO2 100%.Body mass index is 25.47 kg/m.  General Appearance: Casual  Eye Contact:  Good  Speech:  Clear and Coherent and Normal Rate  Volume:  Normal  Mood:  Euthymic  Affect:  Appropriate  Thought Process:  Coherent, Goal Directed, and Descriptions of Associations: Intact  Orientation:  Full (Time, Place, and Person)  Thought Content: WDL   Suicidal Thoughts:  No  Homicidal Thoughts:  No  Memory:  Immediate;   Good Recent;   Good Remote;   Good  Judgement:  Good  Insight:  Good  Psychomotor Activity:  Normal  Concentration:  Concentration: Good and Attention Span: Good  Recall:  Good  Fund of Knowledge: Good  Language: Good  Akathisia:  No  Handed:  Right  AIMS (if indicated): not done  Assets:  Communication Skills Desire for Improvement Housing Resilience Social Support Talents/Skills Vocational/Educational  ADL's:  Intact  Cognition: WNL  Sleep:  Good   Screenings: GAD-7    Flowsheet Row Clinical Support from 08/03/2023 in [facility] Counselor from 07/04/2023 in [facility] Video Visit from 06/01/2023 in [facility] Clinical Support from 04/06/2023 in [facility] Clinical Support from 02/23/2023 in [facility]  Total  GAD-7 Score 10 4 4 8 14       PHQ2-9    Flowsheet Row Clinical Support from 08/03/2023 in [facility] Counselor from 07/04/2023 in [facility] Video Visit from 06/01/2023 in  [facility] Clinical Support from 04/06/2023 in [facility] Clinical Support from 02/23/2023 in [facility]  PHQ-2 Total Score 2 2 1 1 3   PHQ-9 Total Score 6 2 -- -- 5      Flowsheet Row Clinical Support from 08/03/2023 in [facility] Video Visit from 06/01/2023 in [facility] Clinical Support from 04/06/2023 in [facility]  C-SSRS RISK CATEGORY Moderate Risk Moderate Risk Moderate Risk        Assessment and Plan:   Patrick Alexander is a 38 year old, Caucasian male with a past psychiatric history significant for attention deficit hyperactivity disorder (combined type), alcohol  use disorder (severe/dependence, in early remission), generalized anxiety disorder, chronic PTSD, and major depressive disorder who presents to [facility] for follow-up and medication management.  Patient presents to the encounter stating that he continues to take his medications as prescribed.  He did informed the provider that he has been taking an additional dose of Strattera  for the management of his inattentiveness and decreased concentration.  He reports that he does not experience any adverse side effects when taking an additional dose of his Strattera  and is able to get his activities completed.  Provider to adjust patient's Strattera  dosage from 40 mg to 40 mg 2 times daily for the management of his inattentiveness and lack of concentration.  Patient reports that he is no longer taking his midday dose of clonidine  and reports that he has been fine with the help the dose.  Patient continues to endorse depression and anxiety on occasion.  He reports that he experiences depression mostly when receiving bad news but is able to talk himself through his episodes.  He still endorses anxiety, especially when out in  public.  Despite experiencing anxiety, patient reports that he knew longer needs his afternoon dose of buspirone  and states that he only utilizes his morning and evening dose to help with his anxiety.  Patient's buspirone  to be adjusted from 7.5 mg 3 times daily to 7.5 mg 2 times daily for the management of his anxiety.  Patient to continue taking all of his other medications as prescribed.  Patient's medications to be e-prescribed to pharmacy of choice.  A [place] Suicide Severity Rating Scale was performed with the patient being considered at moderate risk.  Patient denied suicidal ideations at this time and was able to contract for safety following the conclusion of the encounter.  Patient's blood pressure was elevated prior to the conclusion of the encounter.  Patient is currently being seen by primary care provider Graig Lawyer, MD).  Patient's blood pressure to be addressed by his primary care provider.  Collaboration of Care: Collaboration of Care: Medication Management AEB provider managing patient's psychiatric medications and Psychiatrist AEB patient being followed by mental health provider at this facility  Patient/Guardian was advised Release of Information must be obtained prior to any record release in order to collaborate their care with an outside provider. Patient/Guardian was advised if they have not already done so to contact the registration department to sign all necessary forms in order for us  to release information regarding their care.   Consent: Patient/Guardian  gives verbal consent for treatment and assignment of benefits for services provided during this visit. Patient/Guardian expressed understanding and agreed to proceed.   1. Attention deficit hyperactivity disorder (ADHD), combined type  - atomoxetine  (STRATTERA ) 40 MG capsule; Take 1 capsule (40 mg total) by mouth 2 (two) times daily with a meal.  Dispense: 60 capsule; Refill: 2 - cloNIDine  (CATAPRES ) 0.1 MG  tablet; Take 0.5-1 tablets (0.05-0.1 mg total) by mouth 3 (three) times daily as needed.Not to exceed 2 full tablets per day.  Dispense: 60 tablet; Refill: 2 - buPROPion  (WELLBUTRIN  XL) 300 MG 24 hr tablet; Take 1 tablet (300 mg total) by mouth every morning.  Dispense: 30 tablet; Refill: 2  2. Generalized anxiety disorder  - busPIRone  (BUSPAR ) 7.5 MG tablet; Take 1 tablet (7.5 mg total) by mouth 2 (two) times daily.  Dispense: 60 tablet; Refill: 2 - cloNIDine  (CATAPRES ) 0.1 MG tablet; Take 0.5-1 tablets (0.05-0.1 mg total) by mouth 3 (three) times daily as needed.Not to exceed 2 full tablets per day.  Dispense: 60 tablet; Refill: 2  3. Alcohol  use disorder, severe, dependence (HCC)  - naltrexone  (DEPADE) 50 MG tablet; Take 1 tablet (50 mg total) by mouth at bedtime.  Dispense: 30 tablet; Refill: 2  4. Moderate episode of recurrent major depressive disorder (HCC)  - buPROPion  (WELLBUTRIN  XL) 300 MG 24 hr tablet; Take 1 tablet (300 mg total) by mouth every morning.  Dispense: 30 tablet; Refill: 2  5. Psychophysiological insomnia  - Melatonin 10 MG TABS; Take 10 mg by mouth at bedtime as needed.  Dispense: 30 tablet; Refill: 2  Patient to follow-up in 2 months Provider spent a total of 26 minutes with the patient/reviewing patient's chart  Gates Kasal, PA 08/03/2023, 3:25 PM

## 2023-08-08 ENCOUNTER — Other Ambulatory Visit: Payer: Self-pay

## 2023-08-09 ENCOUNTER — Other Ambulatory Visit: Payer: Self-pay

## 2023-08-10 ENCOUNTER — Other Ambulatory Visit: Payer: Self-pay

## 2023-08-11 ENCOUNTER — Other Ambulatory Visit: Payer: Self-pay

## 2023-08-12 ENCOUNTER — Other Ambulatory Visit: Payer: Self-pay

## 2023-08-15 ENCOUNTER — Other Ambulatory Visit: Payer: Self-pay

## 2023-08-16 ENCOUNTER — Other Ambulatory Visit: Payer: Self-pay

## 2023-08-17 ENCOUNTER — Other Ambulatory Visit: Payer: Self-pay

## 2023-08-22 ENCOUNTER — Ambulatory Visit (INDEPENDENT_AMBULATORY_CARE_PROVIDER_SITE_OTHER): Payer: MEDICAID | Admitting: Clinical

## 2023-08-22 DIAGNOSIS — F331 Major depressive disorder, recurrent, moderate: Secondary | ICD-10-CM

## 2023-08-22 NOTE — Progress Notes (Signed)
 THERAPIST PROGRESS NOTE  Session Time: 50 minutes  Participation Level: Active  Behavioral Response: CasualAlertDepressed  Type of Therapy: Individual Therapy  Treatment Goals addressed: client will attend at least 80% of scheduled individual psychotherapy sessions  ProgressTowards Goals: Not Progressing  Interventions: CBT and Supportive  Summary:  Patrick Alexander is a 38 y.o. male who presents for the scheduled appointment oriented x 5, appropriately dressed, and friendly.  Client denied hallucinations and delusions. Client reported on today he has been struggling with depression and anxiety.  Client reported he was recently blindsided by his dad reported that he was approached by somebody stating that they needed a new pastor.  Client reported the church that his dad is currently serving at has also provided a [place] where he has been living.  Client reported this sparked a heated conversation between him and his parents.  Client reported his mother in particular is emotionally incompetent and tends to say below the belt comments to him or reacts to try to spark a reaction from his dad. Client reported in pointing out that could mean that he will possibly be homeless and living in between places his mother has made comments such as "you're 37 with nothing to show for it". Client reported his living situation is comparable to someone who lives with their parents because it is technically tied to his dad's job. Client reported this had caused him to relapse using beer over the past week.  Client reported he about a 12 pack.  Client reported her use is related to stress of the news and dealing with his mother.  Client reported he has always had a sense of trying to clear his other people and when he cannot it causes him some distress as well.  Client reported he had stopped taking his naltrexone  medication as well as not attending usual NA meetings virtually in the morning.  Client reported  after discussion in today's session he can now see how to reframe some of the negative thoughts he has been having and how to get back on track with making some positive choices based on his own motivation to see himself to better. Evidence of progress towards goal: Client admitted that at least 4 days out of 7 he did not take his necessary prescribed medication to help with the decrease of alcohol  use.  Client reported at least 2 cognitive patterns that relate to depression based on his interactions with his family.  Suicidal/Homicidal: Nowithout intent/plan  Therapist Response:  Therapist began the appointment asking the client how he has been doing since last seen. Therapist engaged with active listening and positive emotional support. Therapist used CBT to ask the client about recent stressors/triggers that have contributed to his negative mood and relapse of alcohol . Therapist used CBT to normalize the clients thoughts and emotions within reason and asked him to identify negative cognitive patterns and behaviors that enable negative actions ongoing. Therapist used CBT to teach the client about urge surfing and reframing some of the negative thoughts that he has about himself or situations that he could potentially be. Therapist used CBT ask the client to identify her progress with frequency of use with coping skills with continued practice in her daily activity.    Therapist assigned client homework to read over the educational worksheets given to him about urge surfing.   Plan: Return again in 4 weeks.  Diagnosis: moderate episode of recurrent major depressive disorder  Collaboration of Care: Patient refused AEB none requested by  the client.  Patient/Guardian was advised Release of Information must be obtained prior to any record release in order to collaborate their care with an outside provider. Patient/Guardian was advised if they have not already done so to contact the registration  department to sign all necessary forms in order for us  to release information regarding their care.   Consent: Patient/Guardian gives verbal consent for treatment and assignment of benefits for services provided during this visit. Patient/Guardian expressed understanding and agreed to proceed.   Phyllicia Dudek Y Valentine Barney, LCSW 08/22/2023

## 2023-09-11 ENCOUNTER — Other Ambulatory Visit: Payer: Self-pay

## 2023-09-13 ENCOUNTER — Other Ambulatory Visit: Payer: Self-pay

## 2023-09-13 ENCOUNTER — Ambulatory Visit (INDEPENDENT_AMBULATORY_CARE_PROVIDER_SITE_OTHER): Payer: MEDICAID | Admitting: Clinical

## 2023-09-13 DIAGNOSIS — F331 Major depressive disorder, recurrent, moderate: Secondary | ICD-10-CM | POA: Diagnosis not present

## 2023-09-13 NOTE — Progress Notes (Signed)
 THERAPIST PROGRESS NOTE  Session Time: 60 minutes  Participation Level: Active  Behavioral Response: CasualAlertAnxious  Type of Therapy: Individual Therapy  Treatment Goals addressed: client will engage in at least 80% of scheduled individual psychotherapy sessions  ProgressTowards Goals: Progressing  Interventions: CBT and Supportive  Summary:  Patrick Alexander is a 38 y.o. male who presents for the scheduled appointment oriented x 5, appropriately dressed, and friendly.  Client denied hallucinations and delusions. Client reported on today he is feeling better than he was at the last appointment.  Client reported he still has not heard word from either one of his parents about whether or not his that would take the job working at [employer].  Client reported he does not think it would happen but he is still on guard about that changing.  Client reported over the holiday weekend he went to spend time with an old friend who he recently reconnected with.  Client reported he spent time with him and some of his friends associates at his home in [place] .  Client reported it was a nice change of pace because he had alternate perspective of seeing that he was able to make friends and he could be himself and people were accepting of that.  Client reported that because of reflection about his introverted behavior and overthinking about how he acts in relation to when he is around his family all the time.  Client reported he loves his parents but he does not quite get along with them to an extent.  Client reported he cannot take on the emotional responsibility of what his parents have been through in their life which would cause them to be how they are now.  Client reported spending time with his friend has brought about motivation for moving forward in life especially with his own career.  Client reported his friend pitched the idea that he comes to his house rent free and make some connections for  work that way.  Client reported ultimately understanding that he needs to stay grounded in making sure he is taking steps that give him a solid foundation for his own character and career and not get sweat the way in his situation that seems to be nice. Evidence of progress towards goal:  client reported 1 positive of having boundaries that eliminate negative associates to positive people. Client reported he also has improved motivation about goal planning for his future.  Suicidal/Homicidal: Nowithout intent/plan  Therapist Response:  Therapist began the appointment asking the client how he has been doing since last seen. Therapist engaged using active listening and positive emotional support. Therapist used CBT to ask the client about changes that have occurred since she was last seen. Therapist used CBT to ask the client to describe positive and negative challenge with family and in interpersonal relationships. Therapist used cbt to have the client identify his ability to problem solve and process choices he is looking to make. Therapist used cbt to discuss self confidence and identifying his strengths to continue helping him make decisions that are in his best interests.  Therapist used CBT ask the client to identify his progress with frequency of use with coping skills with continued practice in his daily activity.    Therapist assigned the client homework to practice the skills discussed.   Plan: Return again in 4 weeks.  Diagnosis: moderate episode of recurrent major depressive disorder  Collaboration of Care: Patient refused AEB none requested by the client.  Patient/Guardian was advised  Release of Information must be obtained prior to any record release in order to collaborate their care with an outside provider. Patient/Guardian was advised if they have not already done so to contact the registration department to sign all necessary forms in order for us  to release information  regarding their care.   Consent: Patient/Guardian gives verbal consent for treatment and assignment of benefits for services provided during this visit. Patient/Guardian expressed understanding and agreed to proceed.   Krishav Mamone Y Okie Jansson, LCSW 09/13/2023

## 2023-09-16 ENCOUNTER — Other Ambulatory Visit: Payer: Self-pay

## 2023-09-28 ENCOUNTER — Ambulatory Visit (HOSPITAL_COMMUNITY): Payer: MEDICAID | Admitting: Physician Assistant

## 2023-09-28 DIAGNOSIS — F331 Major depressive disorder, recurrent, moderate: Secondary | ICD-10-CM | POA: Diagnosis not present

## 2023-09-28 DIAGNOSIS — F411 Generalized anxiety disorder: Secondary | ICD-10-CM | POA: Diagnosis not present

## 2023-09-28 DIAGNOSIS — F902 Attention-deficit hyperactivity disorder, combined type: Secondary | ICD-10-CM | POA: Diagnosis not present

## 2023-09-28 DIAGNOSIS — F5104 Psychophysiologic insomnia: Secondary | ICD-10-CM | POA: Diagnosis not present

## 2023-09-29 ENCOUNTER — Other Ambulatory Visit: Payer: Self-pay

## 2023-09-29 ENCOUNTER — Other Ambulatory Visit (HOSPITAL_COMMUNITY): Payer: Self-pay

## 2023-09-29 ENCOUNTER — Encounter (HOSPITAL_COMMUNITY): Payer: Self-pay | Admitting: Physician Assistant

## 2023-09-29 MED ORDER — ATOMOXETINE HCL 40 MG PO CAPS
40.0000 mg | ORAL_CAPSULE | Freq: Two times a day (BID) | ORAL | 3 refills | Status: DC
  Filled 2023-09-29 – 2023-10-14 (×3): qty 60, 30d supply, fill #0
  Filled 2023-11-10: qty 60, 30d supply, fill #1
  Filled 2023-12-08: qty 60, 30d supply, fill #2

## 2023-09-29 MED ORDER — CLONIDINE HCL 0.1 MG PO TABS
0.0500 mg | ORAL_TABLET | Freq: Three times a day (TID) | ORAL | 3 refills | Status: DC | PRN
  Filled 2023-09-29: qty 60, 20d supply, fill #0
  Filled 2023-11-10: qty 60, 20d supply, fill #1

## 2023-09-29 MED ORDER — BUPROPION HCL ER (XL) 300 MG PO TB24
300.0000 mg | ORAL_TABLET | ORAL | 3 refills | Status: DC
  Filled 2023-09-29 – 2023-10-10 (×2): qty 30, 30d supply, fill #0
  Filled 2023-11-10: qty 30, 30d supply, fill #1
  Filled 2023-12-08: qty 30, 30d supply, fill #2

## 2023-09-29 MED ORDER — BUSPIRONE HCL 7.5 MG PO TABS
7.5000 mg | ORAL_TABLET | Freq: Two times a day (BID) | ORAL | 3 refills | Status: DC
  Filled 2023-09-29 – 2023-10-10 (×2): qty 60, 30d supply, fill #0
  Filled 2023-11-10: qty 60, 30d supply, fill #1
  Filled 2023-12-08: qty 60, 30d supply, fill #2

## 2023-09-29 MED ORDER — MELATONIN 10 MG PO TABS
10.0000 mg | ORAL_TABLET | Freq: Every evening | ORAL | 3 refills | Status: AC | PRN
  Filled 2023-09-29: qty 30, 30d supply, fill #0

## 2023-09-29 NOTE — Progress Notes (Signed)
 [facility] MD/PA/NP OP Progress Note  09/28/2023 3:51 PM Patrick Alexander  MRN:  [id number]  Chief Complaint:  Chief Complaint  Patient presents with   Follow-up   Medication Refill   HPI:   Patrick Alexander is a 38 year old, Caucasian male with a past psychiatric history significant for attention deficit hyperactivity disorder (combined type), alcohol  use disorder (severe/dependence, in early remission), generalized anxiety disorder, chronic PTSD, and major depressive disorder who presents to [facility] for follow-up and medication management.  Patient is currently being managed on the following psychiatric medications:  Strattera  40 mg daily Naltrexone  50 mg daily Bupropion  (Wellbutrin  XL) 300 mg 24-hour tablet daily Clonidine  0.1 mg (0.5 to 1 tablet) by mouth 3 times daily as needed Melatonin 10 mg at bedtime as needed Buspirone  7.5 mg 2 times daily  Patient presents to the encounter stating that he is doing well.  He reports being relatively calm as of late and endorses minimal depression.  Patient rates his depression at a 2 out of 10 with 10 being most severe.  Patient reports that he recently started drinking again but denies drinking an excessive amount.  He reports that on occasion, he may have some wine.  Patient reports that he is unsure where he wants to be in regards to his use of alcohol .  Since drinking alcohol , patient reports that he has not been using his naltrexone .  Patient reports that he continues to take his other medications as prescribed and denies experiencing any adverse side effects.  Since discontinuing his naltrexone , patient reports that he has been sleeping better and does not feel as anxious.  A PHQ-9 screen was performed with the patient scoring a 1.  A GAD-7 screen was also performed with the patient scoring a 10.  Patient is alert and oriented x 4, pleasant, calm, cooperative, and fully engaged in conversation during the  encounter.  Patient endorses pleasant mood.  Patient exhibits euthymic mood with appropriate affect.  Patient denies suicidal or homicidal ideations.  He further denies auditory or visual hallucinations and does not appear to be responding to internal/external stimuli.  Prior to taking up drinking, patient reports that he was waking up every 2 hours.  Since drinking and discontinuing naltrexone , patient reports that his sleep is improved.  Patient endorses good appetite and eats on average 2 meals per day.  Patient endorses alcohol  consumption but states that he does not drink in excess.  He reports that his last drink occurred 3 nights ago.  Patient endorses tobacco use and states that he smokes cigarettes sparingly.  Patient denies illicit drug use.  Visit Diagnosis:    ICD-10-CM   1. Attention deficit hyperactivity disorder (ADHD), combined type  F90.2 cloNIDine  (CATAPRES ) 0.1 MG tablet    buPROPion  (WELLBUTRIN  XL) 300 MG 24 hr tablet    atomoxetine  (STRATTERA ) 40 MG capsule    2. Generalized anxiety disorder  F41.1 cloNIDine  (CATAPRES ) 0.1 MG tablet    busPIRone  (BUSPAR ) 7.5 MG tablet    3. Psychophysiological insomnia  F51.04 Melatonin 10 MG TABS    4. Moderate episode of recurrent major depressive disorder (HCC)  F33.1 buPROPion  (WELLBUTRIN  XL) 300 MG 24 hr tablet       Past Psychiatric History:  Patient has a past Portia Brittle history significant for attention deficit hyperactivity disorder, major depressive disorder, PTSD, polysubstance abuse, and generalized anxiety disorder   Patient reports that he has a past history of hospitalization due to mental health.  Patient reports that  the hospitalization occurred around the time he was 21 when he had moved to [place].  Patient reports that he is originally from a [address] town and once he moved to [place], he wanted to leave and by experimenting with drugs.  Patient states that he grew up sheltered and once he moved to [place], he states  that he rebelled against the environment he grew up in.   Patient denies a past history of suicide attempt but states that he has had suicidal thoughts in the past   Patient denies a past history of homicide  Past Medical History:  Past Medical History:  Diagnosis Date   ADD (attention deficit disorder)    Allergy    Anxiety    Depression    Hemorrhoids 02/03/2015   High risk sexual behavior 02/03/2015   History of admission to inpatient psychiatry department 03/09/2022   History of non-suicidal self-harm 03/09/2022   Hypertension    Sleep apnea    Sleep disorder 06/02/2015   Substance abuse (HCC)    Tobacco use disorder 06/02/2015    Past Surgical History:  Procedure Laterality Date   TONSILLECTOMY     TYMPANOSTOMY TUBE PLACEMENT     WISDOM TOOTH EXTRACTION      Family Psychiatric History:  Patient reports that mental health runs on his father's side of the family.  Patient reports that PTSD and generalized anxiety are common on his father's side of the family   Father - Generalized anxiety disorder and major depressive disorder Grandfather (paternal) - Generalized anxiety disorder and major depressive disorder.  Patient reports that his grandfather had a past psychiatric history of paranoid schizophrenia and spent extended periods of time in the hospital   Family history of suicide attempt: Patient denies Family history of homicide: Patient denies Family history of substance abuse: Patient reports that his cousins on his mother's side of the family all struggle with substance abuse  Family History:  Family History  Problem Relation Age of Onset   Allergies Mother    Hyperlipidemia Mother    Diabetes Mother    Cancer Mother        Endometrial   Arthritis Mother    Varicose Veins Mother    Allergies Father    Hypertension Father    Stroke Father    Depression Father    Hyperlipidemia Father    Anxiety disorder Father    Arthritis Father    Cancer Father         Skin, lymphoma   Heart disease Father    Vision loss Father    Allergies Sister    Hyperlipidemia Sister    ADD / ADHD Sister    Varicose Veins Sister    Alcohol  abuse Maternal Aunt    Cancer Maternal Aunt        Skin   Miscarriages / Stillbirths Maternal Aunt    Arthritis Maternal Grandmother    Cancer Maternal Grandmother        Glioblastoma   Cancer Maternal Grandfather        Prostate   Cancer Paternal Grandmother        Breast   Dementia Paternal Grandmother    Anxiety disorder Paternal Grandfather    Depression Paternal Grandfather    Heart disease Paternal Grandfather    Hypertension Paternal Grandfather    Schizophrenia Paternal Grandfather     Social History:  Social History   Socioeconomic History   Marital status: Single    Spouse name: Not on file  Number of children: 0   Years of education: Not on file   Highest education level: Associate degree: occupational, Scientist, product/process development, or vocational program  Occupational History   Occupation: Self-employed    Comment: Variety of jobs when available.  Tobacco Use   Smoking status: Every Day    Current packs/day: 0.00    Average packs/day: 0.3 packs/day for 17.0 years (4.3 ttl pk-yrs)    Types: Cigarettes    Last attempt to quit: 01/30/2015    Years since quitting: 8.6   Smokeless tobacco: Not on file   Tobacco comments:    About 2/day  Vaping Use   Vaping status: Never Used  Substance and Sexual Activity   Alcohol  use: Not Currently   Drug use: Not Currently    Types: Amphetamines, Benzodiazepines, Cocaine, Codeine, Hydrocodone, Marijuana, MDMA (Ecstacy), Methylphenidate, Oxycodone, Psilocybin   Sexual activity: Not Currently    Birth control/protection: None  Other Topics Concern   Not on file  Social History Narrative   Not on file   Social Drivers of Health   Financial Resource Strain: Medium Risk (02/06/2023)   Overall Financial Resource Strain (CARDIA)    Difficulty of Paying Living Expenses:  Somewhat hard  Food Insecurity: Food Insecurity Present (02/06/2023)   Hunger Vital Sign    Worried About Running Out of Food in the Last Year: Never true    Ran Out of Food in the Last Year: Sometimes true  Transportation Needs: Unmet Transportation Needs (02/06/2023)   PRAPARE - Transportation    Lack of Transportation (Medical): No    Lack of Transportation (Non-Medical): Yes  Physical Activity: Sufficiently Active (02/06/2023)   Exercise Vital Sign    Days of Exercise per Week: 7 days    Minutes of Exercise per Session: 130 min  Stress: No Stress Concern Present (02/06/2023)   [employer] of Occupational Health - Occupational Stress Questionnaire    Feeling of Stress : Not at all  Social Connections: Moderately Integrated (02/06/2023)   Social Connection and Isolation Panel    Frequency of Communication with Friends and Family: Three times a week    Frequency of Social Gatherings with Friends and Family: Twice a week    Attends Religious Services: More than 4 times per year    Active Member of [employer] or Organizations: Yes    Attends Engineer, structural: More than 4 times per year    Marital Status: Never married    Allergies:  Allergies  Allergen Reactions   Amoxicillin Nausea And Vomiting   Penicillins Nausea And Vomiting    Metabolic Disorder Labs: Lab Results  Component Value Date   HGBA1C 5.3 03/09/2022   MPG 105.41 03/09/2022   No results found for: PROLACTIN Lab Results  Component Value Date   CHOL 192 03/09/2022   TRIG 104 03/09/2022   HDL 73 03/09/2022   CHOLHDL 2.6 03/09/2022   VLDL 21 03/09/2022   LDLCALC 98 03/09/2022   LDLCALC 120 04/23/2015   Lab Results  Component Value Date   TSH 2.539 03/09/2022    Therapeutic Level Labs: No results found for: LITHIUM No results found for: VALPROATE No results found for: CBMZ  Current Medications: Current Outpatient Medications  Medication Sig Dispense Refill   acetaminophen   (TYLENOL ) 325 MG tablet Take 650 mg by mouth every 6 (six) hours as needed.     atomoxetine  (STRATTERA ) 40 MG capsule Take 1 capsule (40 mg total) by mouth 2 (two) times daily with a meal. 60 capsule 3  buPROPion  (WELLBUTRIN  XL) 300 MG 24 hr tablet Take 1 tablet (300 mg total) by mouth every morning. 30 tablet 3   busPIRone  (BUSPAR ) 7.5 MG tablet Take 1 tablet (7.5 mg total) by mouth 2 (two) times daily. 60 tablet 3   cetirizine  (ZYRTEC  ALLERGY) 10 MG tablet Take 1 tablet (10 mg total) by mouth daily. 30 tablet 1   cloNIDine  (CATAPRES ) 0.1 MG tablet Take 0.5-1 tablets (0.05-0.1 mg total) by mouth 3 (three) times daily as needed. 60 tablet 3   loratadine (CLARITIN) 10 MG tablet Take 10 mg by mouth daily as needed for allergies.     Melatonin 10 MG TABS Take 10 mg by mouth at bedtime as needed. 30 tablet 3   naltrexone  (DEPADE) 50 MG tablet Take 1 tablet (50 mg total) by mouth at bedtime. 30 tablet 2   No current facility-administered medications for this visit.     Musculoskeletal: Strength & Muscle Tone: within normal limits Gait & Station: normal Patient leans: N/A  Psychiatric Specialty Exam: Review of Systems  Psychiatric/Behavioral:  Negative for decreased concentration, dysphoric mood, hallucinations, self-injury, sleep disturbance and suicidal ideas. The patient is not nervous/anxious and is not hyperactive.     Blood pressure (!) 155/107, pulse 89, temperature 97.7 F (36.5 C), temperature source Oral, height 5' 7 (1.702 m), weight 161 lb (73 kg), SpO2 100%.Body mass index is 25.22 kg/m.  General Appearance: Casual  Eye Contact:  Good  Speech:  Clear and Coherent and Normal Rate  Volume:  Normal  Mood:  Euthymic  Affect:  Appropriate  Thought Process:  Coherent, Goal Directed, and Descriptions of Associations: Intact  Orientation:  Full (Time, Place, and Person)  Thought Content: WDL   Suicidal Thoughts:  No  Homicidal Thoughts:  No  Memory:  Immediate;   Good Recent;    Good Remote;   Good  Judgement:  Good  Insight:  Good  Psychomotor Activity:  Normal  Concentration:  Concentration: Good and Attention Span: Good  Recall:  Good  Fund of Knowledge: Good  Language: Good  Akathisia:  No  Handed:  Right  AIMS (if indicated): not done  Assets:  Communication Skills Desire for Improvement Housing Resilience Social Support Talents/Skills Vocational/Educational  ADL's:  Intact  Cognition: WNL  Sleep:  Good   Screenings: AIMS    Flowsheet Row Clinical Support from 09/28/2023 in [facility]  AIMS Total Score 0   GAD-7    Flowsheet Row Clinical Support from 08/03/2023 in [facility] Counselor from 07/04/2023 in [facility] Video Visit from 06/01/2023 in [facility] Clinical Support from 04/06/2023 in [facility] Clinical Support from 02/23/2023 in [facility]  Total GAD-7 Score 10 4 4 8 14    PHQ2-9    Flowsheet Row Clinical Support from 09/28/2023 in [facility] Clinical Support from 08/03/2023 in [facility] Counselor from 07/04/2023 in [facility] Video Visit from 06/01/2023 in [facility] Clinical Support from 04/06/2023 in [facility]  PHQ-2 Total Score 0 2 2 1 1   PHQ-9 Total Score 1 6 2  -- --   Flowsheet Row Clinical Support from 09/28/2023 in [facility] Clinical Support from 08/03/2023 in [facility] Video Visit from 06/01/2023 in [facility]  C-SSRS RISK CATEGORY Error: Q7 should not  be populated when Q6 is No Moderate Risk Moderate Risk     Assessment and Plan:   Patrick Alexander is a 38 year old, Caucasian male with a past psychiatric history  significant for attention deficit hyperactivity disorder (combined type), alcohol  use disorder (severe/dependence, in early remission), generalized anxiety disorder, chronic PTSD, and major depressive disorder who presents to [facility] for follow-up and medication management.  Patient presents to the encounter reporting no issues or concerns regarding his current medication regimen.  Patient denies experiencing any adverse side effects with his current medication regimen.  Patient informed provider that he has since taken up drinking again and has discontinued his naltrexone .  Though he has taken up drinking, patient reports that he is in control of how much she drinks.  He reports that his last drink occurred 3 nights ago and states that he has not felt the need to drink in excess.  Since discontinuing naltrexone  taking up drinking, patient reports that his sleep is improved.  Patient endorses minimal depression and states that his anxiety has been manageable.  A PHQ-9 screen was performed with the patient scoring a 1.  A GAD-7 screen was also performed with the patient scoring of 10.  Despite his anxiety being somewhat elevated, patient denies the need for dosage adjustments on his current medication regimen.  Patient endorses stability and would like to continue taking his medications as prescribed.  Patient's medications to be e-prescribed to pharmacy of choice.  A [place] Suicide Severity Rating Scale was performed with the patient being considered moderate risk.  Patient denies suicidal ideations and is able to contract for safety following the conclusion of the encounter.  Collaboration of Care: Collaboration of Care: Medication Management AEB provider managing patient's psychiatric medications and Psychiatrist AEB patient being followed by mental health provider at this facility  Patient/Guardian was advised Release of Information must be obtained prior to  any record release in order to collaborate their care with an outside provider. Patient/Guardian was advised if they have not already done so to contact the registration department to sign all necessary forms in order for us  to release information regarding their care.   Consent: Patient/Guardian gives verbal consent for treatment and assignment of benefits for services provided during this visit. Patient/Guardian expressed understanding and agreed to proceed.   1. Attention deficit hyperactivity disorder (ADHD), combined type  - cloNIDine  (CATAPRES ) 0.1 MG tablet; Take 0.5-1 tablets (0.05-0.1 mg total) by mouth 3 (three) times daily as needed.  Dispense: 60 tablet; Refill: 3 - buPROPion  (WELLBUTRIN  XL) 300 MG 24 hr tablet; Take 1 tablet (300 mg total) by mouth every morning.  Dispense: 30 tablet; Refill: 3 - atomoxetine  (STRATTERA ) 40 MG capsule; Take 1 capsule (40 mg total) by mouth 2 (two) times daily with a meal.  Dispense: 60 capsule; Refill: 3  2. Generalized anxiety disorder  - cloNIDine  (CATAPRES ) 0.1 MG tablet; Take 0.5-1 tablets (0.05-0.1 mg total) by mouth 3 (three) times daily as needed.  Dispense: 60 tablet; Refill: 3 - busPIRone  (BUSPAR ) 7.5 MG tablet; Take 1 tablet (7.5 mg total) by mouth 2 (two) times daily.  Dispense: 60 tablet; Refill: 3  3. Psychophysiological insomnia  - Melatonin 10 MG TABS; Take 10 mg by mouth at bedtime as needed.  Dispense: 30 tablet; Refill: 3  4. Moderate episode of recurrent major depressive disorder (HCC)  - buPROPion  (WELLBUTRIN  XL) 300 MG 24 hr tablet; Take 1 tablet (300 mg total) by mouth every morning.  Dispense:  30 tablet; Refill: 3  Patient to follow-up in 3 months Provider spent a total of 22 minutes with the patient/reviewing patient's chart  Gates Kasal, PA 09/28/2023, 3:51 PM

## 2023-09-30 ENCOUNTER — Other Ambulatory Visit (HOSPITAL_COMMUNITY): Payer: Self-pay

## 2023-10-03 ENCOUNTER — Ambulatory Visit: Payer: Self-pay

## 2023-10-03 NOTE — Telephone Encounter (Signed)
 FYI Only or Action Required?: Action required by provider  Patient was last seen in primary care on 02/07/2023 by Graig Lawyer, MD. Called Nurse Triage reporting Hypertension. Symptoms began several days ago. Interventions attempted: Nothing. Symptoms are: unchanged.  Triage Disposition: See PCP When Office is Open (Within 3 Days)    Patient/caregiver understands and will follow disposition?: YesCopied from CRM [id number]. Topic: Clinical - Red Word Triage >> Oct 03, 2023  1:41 PM [name] wrote: [facility] that prompted transfer to Nurse Triage: Increased blood pressure. Patient's most recent BP was 155/107, but is currently not experiencing symptoms. Reason for Disposition  [1] Systolic BP  >= 130 OR Diastolic >= 80 AND [2] pregnant  Answer Assessment - Initial Assessment Questions 1. BLOOD PRESSURE: What is the blood pressure? Did you take at least two measurements 5 minutes apart?     140/103; 155/107 2. ONSET: When did you take your blood pressure?     Last Thursday  3. HOW: How did you take your blood pressure? (e.g., automatic home BP monitor, visiting nurse)     Automatic  4. HISTORY: Do you have a history of high blood pressure?     Slowly  increasing over time 5. MEDICINES: Are you taking any medicines for blood pressure? Have you missed any doses recently?     Clonidine   6. OTHER SYMPTOMS: Do you have any symptoms? (e.g., blurred vision, chest pain, difficulty breathing, headache, weakness)     Denies all     Pt is being seen at behavior health and was told his bp keeps creeping and to seek evaluation with PCP. Pt denies all symptoms. Pt feels this anxiety related due to childhood stutter.  Protocols used: Blood Pressure - High-A-AH

## 2023-10-04 ENCOUNTER — Ambulatory Visit (INDEPENDENT_AMBULATORY_CARE_PROVIDER_SITE_OTHER): Payer: MEDICAID | Admitting: Family Medicine

## 2023-10-04 ENCOUNTER — Encounter: Payer: Self-pay | Admitting: Family Medicine

## 2023-10-04 ENCOUNTER — Other Ambulatory Visit: Payer: Self-pay

## 2023-10-04 VITALS — BP 158/104 | HR 120 | Temp 97.0°F | Ht 67.0 in | Wt 162.8 lb

## 2023-10-04 DIAGNOSIS — I10 Essential (primary) hypertension: Secondary | ICD-10-CM

## 2023-10-04 DIAGNOSIS — F410 Panic disorder [episodic paroxysmal anxiety] without agoraphobia: Secondary | ICD-10-CM

## 2023-10-04 DIAGNOSIS — Z23 Encounter for immunization: Secondary | ICD-10-CM

## 2023-10-04 DIAGNOSIS — R000 Tachycardia, unspecified: Secondary | ICD-10-CM | POA: Diagnosis not present

## 2023-10-04 LAB — BASIC METABOLIC PANEL WITH GFR
BUN: 15 mg/dL (ref 6–23)
CO2: 30 meq/L (ref 19–32)
Calcium: 9.2 mg/dL (ref 8.4–10.5)
Chloride: 100 meq/L (ref 96–112)
Creatinine, Ser: 0.89 mg/dL (ref 0.40–1.50)
GFR: 110.68 mL/min (ref >60.00)
Glucose, Bld: 93 mg/dL (ref 70–99)
Potassium: 4.3 meq/L (ref 3.5–5.1)
Sodium: 138 meq/L (ref 135–145)

## 2023-10-04 LAB — TSH: TSH: 2.08 u[IU]/mL (ref 0.35–5.50)

## 2023-10-04 MED ORDER — OLMESARTAN MEDOXOMIL 20 MG PO TABS
20.0000 mg | ORAL_TABLET | Freq: Every day | ORAL | 1 refills | Status: DC
  Filled 2023-10-04: qty 30, 30d supply, fill #0
  Filled 2023-11-01: qty 30, 30d supply, fill #1

## 2023-10-04 NOTE — Telephone Encounter (Signed)
 Scheduled today with Dr Tilmon Font. Dm/cma

## 2023-10-04 NOTE — Progress Notes (Signed)
 Established Patient Office Visit   Subjective:  Patient ID: Patrick Alexander, male    DOB: 04-08-1986  Age: 38 y.o. MRN: [id number]  Chief Complaint  Patient presents with   Hypertension    Elevated blood pressure readings. Pt states he has had a average of 150s-100s x 1 year. Pt states he notices that he stutters only when his bp is elevated.     Hypertension Pertinent negatives include no blurred vision or headaches.   Encounter Diagnoses  Name Primary?   Essential hypertension Yes   Anxiety attack    Tachycardia    Patient reports elevated blood pressure and multiple clinical settings over the last 6 months.  He has been asymptomatic.  He denies headaches visual changes or chest pain shortness of breath.  Hypertension runs on his father side.  No recent changes in medications.  He takes 0.5 of clonidine  in the morning and point 3 in the afternoon.  He does not stop and start this medication.  Denies increased stress in his personal life.  He has breakfast for all 3 of his meals.  He did have 3 sausage patties this morning.  Smokes a pack of cigarettes every 3 days.  Maintains his sobriety and has been clean of illicit drug use.  Pressure does go up when he is anxious.  He is having an anxiety attack now.   Review of Systems  Constitutional: Negative.   HENT: Negative.    Eyes:  Negative for blurred vision, discharge and redness.  Respiratory: Negative.    Cardiovascular: Negative.   Gastrointestinal:  Negative for abdominal pain.  Genitourinary: Negative.   Musculoskeletal: Negative.  Negative for myalgias.  Skin:  Negative for rash.  Neurological:  Negative for dizziness, tingling, loss of consciousness, weakness and headaches.  Endo/Heme/Allergies:  Negative for polydipsia.     Current Outpatient Medications:    acetaminophen  (TYLENOL ) 325 MG tablet, Take 650 mg by mouth every 6 (six) hours as needed., Disp: , Rfl:    atomoxetine  (STRATTERA ) 40 MG capsule, Take 1 capsule  (40 mg total) by mouth 2 (two) times daily with a meal., Disp: 60 capsule, Rfl: 3   buPROPion  (WELLBUTRIN  XL) 300 MG 24 hr tablet, Take 1 tablet (300 mg total) by mouth every morning., Disp: 30 tablet, Rfl: 3   busPIRone  (BUSPAR ) 7.5 MG tablet, Take 1 tablet (7.5 mg total) by mouth 2 (two) times daily., Disp: 60 tablet, Rfl: 3   cetirizine  (ZYRTEC  ALLERGY) 10 MG tablet, Take 1 tablet (10 mg total) by mouth daily., Disp: 30 tablet, Rfl: 1   cloNIDine  (CATAPRES ) 0.1 MG tablet, Take 0.5-1 tablets (0.05-0.1 mg total) by mouth 3 (three) times daily as needed., Disp: 60 tablet, Rfl: 3   loratadine (CLARITIN) 10 MG tablet, Take 10 mg by mouth daily as needed for allergies., Disp: , Rfl:    Melatonin 10 MG TABS, Take 10 mg by mouth at bedtime as needed., Disp: 30 tablet, Rfl: 3   olmesartan (BENICAR) 20 MG tablet, Take 1 tablet (20 mg total) by mouth daily., Disp: 30 tablet, Rfl: 1   naltrexone  (DEPADE) 50 MG tablet, Take 1 tablet (50 mg total) by mouth at bedtime. (Patient not taking: Reported on 10/04/2023), Disp: 30 tablet, Rfl: 2   Objective:     BP (!) 158/104 (Cuff Size: Normal)   Pulse (!) 120   Temp (!) 97 F (36.1 C) (Temporal)   Ht 5' 7 (1.702 m)   Wt 162 lb 12.8 oz (73.8 kg)  SpO2 99%   BMI 25.50 kg/m  BP Readings from Last 3 Encounters:  10/04/23 (!) 158/104  02/07/23 122/76  01/05/23 134/78   Wt Readings from Last 3 Encounters:  10/04/23 162 lb 12.8 oz (73.8 kg)  02/07/23 149 lb 6.4 oz (67.8 kg)  01/05/23 152 lb 3.2 oz (69 kg)      Physical Exam Constitutional:      General: He is not in acute distress.    Appearance: Normal appearance. He is not ill-appearing, toxic-appearing or diaphoretic.  HENT:     Head: Normocephalic and atraumatic.     Right Ear: External ear normal.     Left Ear: External ear normal.     Mouth/Throat:     Mouth: Mucous membranes are moist.     Pharynx: Oropharynx is clear. No oropharyngeal exudate or posterior oropharyngeal erythema.    Eyes:     General: No scleral icterus.       Right eye: No discharge.        Left eye: No discharge.     Extraocular Movements: Extraocular movements intact.     Conjunctiva/sclera: Conjunctivae normal.     Pupils: Pupils are equal, round, and reactive to light.    Cardiovascular:     Rate and Rhythm: Normal rate and regular rhythm.  Pulmonary:     Effort: Pulmonary effort is normal. No respiratory distress.     Breath sounds: Normal breath sounds. No wheezing or rales.  Abdominal:     General: Bowel sounds are normal.   Musculoskeletal:     Cervical back: No rigidity or tenderness.   Skin:    General: Skin is warm and dry.   Neurological:     Mental Status: He is alert and oriented to person, place, and time.   Psychiatric:        Mood and Affect: Mood normal.        Behavior: Behavior normal.      No results found for any visits on 10/04/23.    The ASCVD Risk score (Arnett DK, et al., 2019) failed to calculate for the following reasons:   The 2019 ASCVD risk score is only valid for ages 16 to 98    Assessment & Plan:   Essential hypertension -     Basic metabolic panel with GFR -     Olmesartan Medoxomil; Take 1 tablet (20 mg total) by mouth daily.  Dispense: 30 tablet; Refill: 1  Anxiety attack -     TSH  Tachycardia -     TSH    Return Schedule follow-up with Dr. Therese Flash in 4 to 6 weeks..  Start olmesartan.  Information on managing hypertension and olmesartan given.  Discussed the salt load and sausage.  Elevated pulse rate likely due to to anxiety attack.  Tonna Frederic, MD

## 2023-10-05 ENCOUNTER — Other Ambulatory Visit: Payer: Self-pay

## 2023-10-10 ENCOUNTER — Other Ambulatory Visit: Payer: Self-pay

## 2023-10-10 ENCOUNTER — Ambulatory Visit (INDEPENDENT_AMBULATORY_CARE_PROVIDER_SITE_OTHER): Payer: MEDICAID | Admitting: Clinical

## 2023-10-10 DIAGNOSIS — F411 Generalized anxiety disorder: Secondary | ICD-10-CM | POA: Diagnosis not present

## 2023-10-10 NOTE — Progress Notes (Signed)
   THERAPIST PROGRESS NOTE  Session Time: 60 minutes  Participation Level: Active  Behavioral Response: CasualAlertEuthymic  Type of Therapy: Individual Therapy  Treatment Goals addressed: Client will engage in at least 80% of scheduled individual psychotherapy sessions  ProgressTowards Goals: Progressing  Interventions: CBT and Supportive  Summary:  Patrick Alexander is a 38 y.o. male who presents for the scheduled appointment oriented x 5, appropriately dressed, and friendly. Client denied hallucinations and delusions. Client reported all today things have been going pretty well.  Client reported he has no major complaints.  Client reported lately things have been feeling a lot calmer with his family.  Client reported he recently bought something for his nephew and although his sister did not call him she sent him pictures of his nephew using it.  Client reported likewise with his mom he sent her some candy that he knows she likes on her birthday and she has not bothered him this much.  Client reported at his last psychiatry appointment he had high blood pressure so he went to see his PCP.  Client reported he gave him great anxiety because he had to see a different doctor for that visit and he was being pretty judged for his documented history of substance use. Client reported he is on high blood pressure medication now.  Client reported he is taking his time with eating.  Client reported a lot of red flags of been going on that he does not want to be around.  Client reported he also looked into some classes that he can take at [employer] regarding his different trades.  Client reported that seems to be a lot more promising as it might hard-core commitment to a university. Evidence of progress towards goal:  client reported 1 positive which is looking up school options for trades he is interested in.   Suicidal/Homicidal: Nowithout intent/plan  Therapist Response:  Therapist began the appointment  asking the client how he has been doing since last seen. Therapist engaged with active listening and positive emotional support. Therapist used CBT to ask the client to discuss any changes that have occurred. Therapist used CBT to positively reinforce the clients initiative to further pursue goals of education to improve his employment opportunities as well as his mindfulness and learning to create appropriate boundaries with people. Therapist used CBT ask the client to identify his progress with frequency of use with coping skills with continued practice in his daily activity.       Plan: Return again in 4 weeks.  Diagnosis: gad  Collaboration of Care: Patient refused AEB none.  Patient/Guardian was advised Release of Information must be obtained prior to any record release in order to collaborate their care with an outside provider. Patient/Guardian was advised if they have not already done so to contact the registration department to sign all necessary forms in order for us  to release information regarding their care.   Consent: Patient/Guardian gives verbal consent for treatment and assignment of benefits for services provided during this visit. Patient/Guardian expressed understanding and agreed to proceed.   Akylah Hascall Y Shannen Vernon, LCSW 10/10/2023

## 2023-10-14 ENCOUNTER — Other Ambulatory Visit: Payer: Self-pay

## 2023-11-07 ENCOUNTER — Ambulatory Visit (INDEPENDENT_AMBULATORY_CARE_PROVIDER_SITE_OTHER): Payer: MEDICAID | Admitting: Clinical

## 2023-11-07 DIAGNOSIS — F411 Generalized anxiety disorder: Secondary | ICD-10-CM

## 2023-11-07 NOTE — Progress Notes (Signed)
   THERAPIST PROGRESS NOTE  Session Time: 60 minutes  Participation Level: Active  Behavioral Response: CasualAlertEuthymic  Type of Therapy: Individual Therapy  Treatment Goals addressed: client will engage in at least 80% scheduled individual psychotherapy sessions  ProgressTowards Goals: Progressing  Interventions: CBT and Supportive  Summary:  Patrick Alexander is a 38 y.o. male who presents for the scheduled appointment oriented times five, appropriately dressed and friendly. Client denied hallucinations and delusions. Client reported on today he is feeling good. Client reported he met a guy on hinge in early July. Client reported they spent the holiday and his birthday together. Client reported they have spent the day together. Client reported he has moved in to the guys house. Client reported they seem to be getting along very well. Client reported he is in [place] now. Client reported he is applying for some kind of job to put money in his pocket. 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 Client reported he feels more free and finding he is not as worried about living up to certain expectations that caused him stress. Client reported he reflects on whether he is making the right choice. Client reported he sometimes has thoughts of worrying about if e should ask his parents about his choice because he has often been blamed for things going wrong that he was not even related to. Client reported for awhile he has kept a backpack on metaphorically because of his living arrangements. Client reported otherwise he is going to continue being mindful of taking things slow. Evidence of progress towards goal:  client reported 1 cognitive pattern he is mindful of when making decisions base don previous scenarios he can learn from.   Suicidal/Homicidal: Nowithout intent/plan  Therapist Response:  Therapist began the appointment asking the client how he has been doing. Therapist engaged  with active listening and positive emotional support. Therapist used cbt to engage and ask him about changes. Therapist used cbt to engage in teaching about boundaries in interpersonal relationships and appropriate assertive communications. Therapist used CBT ask the client to identify his progress with frequency of use with coping skills with continued practice in his daily activity.    Therapist assigned the client homework to practice the 90 day boundary technique.    Plan: Return again in 3 weeks.  Diagnosis: GAD  Collaboration of Care: Patient refused AEB none requested by the client.  Patient/Guardian was advised Release of Information must be obtained prior to any record release in order to collaborate their care with an outside provider. Patient/Guardian was advised if they have not already done so to contact the registration department to sign all necessary forms in order for us  to release information regarding their care.   Consent: Patient/Guardian gives verbal consent for treatment and assignment of benefits for services provided during this visit. Patient/Guardian expressed understanding and agreed to proceed.   Kaysin Brock Y Kortez Murtagh, LCSW 11/07/2023

## 2023-11-10 ENCOUNTER — Other Ambulatory Visit: Payer: Self-pay

## 2023-11-11 ENCOUNTER — Other Ambulatory Visit: Payer: Self-pay | Admitting: Medical Genetics

## 2023-11-15 ENCOUNTER — Ambulatory Visit: Payer: Self-pay | Admitting: Family Medicine

## 2023-11-15 ENCOUNTER — Ambulatory Visit (INDEPENDENT_AMBULATORY_CARE_PROVIDER_SITE_OTHER): Payer: MEDICAID | Admitting: Family Medicine

## 2023-11-15 ENCOUNTER — Other Ambulatory Visit: Payer: Self-pay

## 2023-11-15 ENCOUNTER — Encounter: Payer: Self-pay | Admitting: Family Medicine

## 2023-11-15 DIAGNOSIS — I10 Essential (primary) hypertension: Secondary | ICD-10-CM | POA: Insufficient documentation

## 2023-11-15 LAB — BASIC METABOLIC PANEL WITH GFR
BUN: 16 mg/dL (ref 6–23)
CO2: 28 meq/L (ref 19–32)
Calcium: 9.4 mg/dL (ref 8.4–10.5)
Chloride: 102 meq/L (ref 96–112)
Creatinine, Ser: 0.86 mg/dL (ref 0.40–1.50)
GFR: 111.74 mL/min (ref >60.00)
Glucose, Bld: 102 mg/dL — ABNORMAL HIGH (ref 70–99)
Potassium: 4.4 meq/L (ref 3.5–5.1)
Sodium: 138 meq/L (ref 135–145)

## 2023-11-15 MED ORDER — OLMESARTAN MEDOXOMIL 20 MG PO TABS
20.0000 mg | ORAL_TABLET | Freq: Every day | ORAL | 3 refills | Status: AC
  Filled 2023-11-15 – 2023-12-01 (×2): qty 90, 90d supply, fill #0
  Filled 2024-02-27: qty 90, 90d supply, fill #1
  Filled 2024-05-25: qty 90, 90d supply, fill #2

## 2023-11-15 NOTE — Progress Notes (Signed)
 [facility] [address] [place] [address] Dept: [phone] Dept Fax: [phone]  Chronic Care Office Visit  Subjective:    Patient ID: Patrick Alexander, male    DOB: 1985/06/12, 38 y.o..   MRN: [id number]  Chief Complaint  Patient presents with   Follow-up    5 week f/u.  No concerns.     History of Present Illness:  Patient is in today for reassessment of chronic medical issues.  Patrick Alexander is in for reassessment of his blood pressure. He was seen by Dr. Berneta  6 weeks ago with BP of 158/104. This had been becoming an issue over the previously 6 months. He related this to stressors in his living situation. Dr. Berneta started him on olmesartan  20 mg daily. He was already taking clonidine  0.1 mg 1/2-1 tab TID as needed related to his other BH issues. Patrick Alexander notes he is being careful about sodium intake. He also limits his caffeine use.  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   Seasonal allergic rhinitis due to pollen 08/19/2015   Positive ANA (antinuclear antibody) 08/19/2015   OSA on CPAP 08/19/2015   Tobacco use disorder 06/02/2015   Obsessive-compulsive disorder, unspecified 08/05/2012   Past Surgical History:  Procedure Laterality Date   TONSILLECTOMY     TYMPANOSTOMY TUBE PLACEMENT     WISDOM TOOTH EXTRACTION      Family History  Problem Relation Age of Onset   Allergies Mother    Hyperlipidemia Mother    Diabetes Mother    Cancer Mother        Endometrial   Arthritis Mother    Varicose Veins Mother    Allergies Father    Hypertension Father    Stroke Father    Depression Father    Hyperlipidemia Father    Anxiety disorder Father    Arthritis Father    Cancer Father        Skin, lymphoma   Heart disease Father    Vision loss Father    Allergies Sister    Hyperlipidemia Sister    ADD / ADHD Sister    Varicose Veins Sister    Alcohol  abuse Maternal Aunt    Cancer Maternal Aunt        Skin   Miscarriages / Stillbirths Maternal Aunt    Arthritis Maternal Grandmother    Cancer Maternal Grandmother        Glioblastoma   Cancer Maternal Grandfather        Prostate   Cancer Paternal Grandmother        Breast   Dementia Paternal Grandmother    Anxiety disorder Paternal Grandfather    Depression Paternal Grandfather    Heart disease Paternal Grandfather    Hypertension Paternal Grandfather    Schizophrenia Paternal Grandfather    Outpatient Medications Prior to Visit  Medication Sig Dispense Refill   acetaminophen  (TYLENOL ) 325 MG tablet Take 650 mg by mouth every 6 (six) hours  as needed.     atomoxetine  (STRATTERA ) 40 MG capsule Take 1 capsule (40 mg total) by mouth 2 (two) times daily with a meal. 60 capsule 3   buPROPion  (WELLBUTRIN  XL) 300 MG 24 hr tablet Take 1 tablet (300 mg total) by mouth every morning. 30 tablet 3   busPIRone  (BUSPAR ) 7.5 MG tablet Take 1 tablet (7.5 mg total) by mouth 2 (two) times daily. 60 tablet 3   cetirizine  (ZYRTEC  ALLERGY) 10 MG tablet Take 1 tablet (10 mg total) by mouth daily. 30 tablet 1   cloNIDine  (CATAPRES ) 0.1 MG tablet Take 0.5-1 tablets (0.05-0.1 mg total) by mouth 3 (three) times daily as needed. 60 tablet 3   loratadine (CLARITIN) 10 MG tablet Take 10 mg by mouth daily as needed for allergies.     Melatonin 10 MG TABS Take 10 mg by mouth  at bedtime as needed. 30 tablet 3   olmesartan  (BENICAR ) 20 MG tablet Take 1 tablet (20 mg total) by mouth daily. 30 tablet 1   naltrexone  (DEPADE) 50 MG tablet Take 1 tablet (50 mg total) by mouth at bedtime. (Patient not taking: Reported on 11/15/2023) 30 tablet 2   No facility-administered medications prior to visit.   Allergies  Allergen Reactions   Amoxicillin Nausea And Vomiting   Penicillins Nausea And Vomiting   Objective:   Today's Vitals   11/15/23 0953  BP: 130/84  Pulse: (!) 104  Temp: 97.9 F (36.6 C)  TempSrc: Temporal  SpO2: 100%  Weight: 162 lb 12.8 oz (73.8 kg)  Height: 5' 7 (1.702 m)   Body mass index is 25.5 kg/m.   General: Well developed, well nourished. No acute distress. Psych: Alert and oriented. Normal mood and affect.  Health Maintenance Due  Topic Date Due   HPV VACCINES (1 - 3-dose SCDM series) Never done   Lab Results    Latest Ref Rng & Units 10/04/2023    2:46 PM 02/07/2023    9:29 AM 03/09/2022   12:14 AM  CMP  Glucose 70 - 99 mg/dL 93  92  91   BUN 6 - 23 mg/dL 15  19  15    Creatinine 0.40 - 1.50 mg/dL 9.10  8.85  9.02   Sodium 135 - 145 mEq/L 138  139  141   Potassium 3.5 - 5.1 mEq/L 4.3  4.1  3.9   Chloride 96 - 112 mEq/L 100  100  102   CO2 19 - 32 mEq/L 30  29  26    Calcium 8.4 - 10.5 mg/dL 9.2  9.4  9.4   Total Protein 6.0 - 8.3 g/dL  6.7  6.3   Total Bilirubin 0.2 - 1.2 mg/dL  0.4  0.6   Alkaline Phos 39 - 117 U/L  51  48   AST 0 - 37 U/L  15  32   ALT 0 - 53 U/L  11  31    Last thyroid  functions Lab Results  Component Value Date   TSH 2.08 10/04/2023      Assessment & Plan:   Problem List Items Addressed This Visit       Cardiovascular and Mediastinum   Essential hypertension   BP is in acceptable control and improved from his last visit. Continue olmesartan  20 mg daily. I will check a BMP to make sure electrolytes and renal function remain normal.      Relevant Medications   olmesartan  (BENICAR ) 20 MG  tablet   Other Relevant Orders  Basic metabolic panel with GFR    Return in about 3 months (around 02/15/2024) for Reassessment.   Patrick CHRISTELLA Simpler, MD

## 2023-11-15 NOTE — Assessment & Plan Note (Signed)
 BP is in acceptable control and improved from his last visit. Continue olmesartan  20 mg daily. I will check a BMP to make sure electrolytes and renal function remain normal.

## 2023-11-23 ENCOUNTER — Ambulatory Visit (HOSPITAL_COMMUNITY): Payer: MEDICAID | Admitting: Clinical

## 2023-12-01 ENCOUNTER — Other Ambulatory Visit: Payer: Self-pay

## 2023-12-02 ENCOUNTER — Other Ambulatory Visit: Payer: Self-pay

## 2023-12-08 ENCOUNTER — Other Ambulatory Visit: Payer: Self-pay

## 2023-12-09 ENCOUNTER — Other Ambulatory Visit: Payer: Self-pay

## 2023-12-12 ENCOUNTER — Ambulatory Visit (HOSPITAL_COMMUNITY): Payer: MEDICAID | Admitting: Clinical

## 2023-12-28 ENCOUNTER — Ambulatory Visit (HOSPITAL_COMMUNITY): Payer: MEDICAID | Admitting: Physician Assistant

## 2023-12-28 ENCOUNTER — Encounter (HOSPITAL_COMMUNITY): Payer: Self-pay | Admitting: Physician Assistant

## 2023-12-28 ENCOUNTER — Other Ambulatory Visit: Payer: Self-pay

## 2023-12-28 DIAGNOSIS — F331 Major depressive disorder, recurrent, moderate: Secondary | ICD-10-CM

## 2023-12-28 DIAGNOSIS — F411 Generalized anxiety disorder: Secondary | ICD-10-CM

## 2023-12-28 DIAGNOSIS — F902 Attention-deficit hyperactivity disorder, combined type: Secondary | ICD-10-CM

## 2023-12-28 MED ORDER — CLONIDINE HCL 0.1 MG PO TABS
0.0500 mg | ORAL_TABLET | Freq: Every day | ORAL | 2 refills | Status: DC
  Filled 2023-12-28: qty 15, 30d supply, fill #0

## 2023-12-28 MED ORDER — ATOMOXETINE HCL 40 MG PO CAPS
40.0000 mg | ORAL_CAPSULE | Freq: Two times a day (BID) | ORAL | 2 refills | Status: DC
  Filled 2023-12-28 – 2024-01-07 (×2): qty 60, 30d supply, fill #0
  Filled 2024-02-06: qty 60, 30d supply, fill #1

## 2023-12-28 MED ORDER — BUSPIRONE HCL 5 MG PO TABS
5.0000 mg | ORAL_TABLET | Freq: Two times a day (BID) | ORAL | 2 refills | Status: DC
  Filled 2023-12-28: qty 60, 30d supply, fill #0

## 2023-12-28 MED ORDER — BUPROPION HCL ER (XL) 300 MG PO TB24
300.0000 mg | ORAL_TABLET | ORAL | 2 refills | Status: DC
  Filled 2023-12-28 – 2024-01-07 (×2): qty 30, 30d supply, fill #0
  Filled 2024-02-06: qty 30, 30d supply, fill #1

## 2023-12-28 NOTE — Progress Notes (Signed)
 [facility] MD/PA/NP OP Progress Note  12/28/2023 1:00 PM Patrick Alexander  MRN:  [id number]  Chief Complaint:  Chief Complaint  Patient presents with   Follow-up   Medication Management   HPI:   Patrick Alexander is a 38 year old, Caucasian male with a past psychiatric history significant for attention deficit hyperactivity disorder (combined type), alcohol  use disorder (severe/dependence, in early remission), generalized anxiety disorder, chronic PTSD, and major depressive disorder who presents to [facility] for follow-up and medication management.  Patient is currently being managed on the following psychiatric medications:  Strattera  40 mg 2 times daily Naltrexone  50 mg daily Bupropion  (Wellbutrin  XL) 300 mg 24-hour tablet daily Clonidine  0.1 mg (0.5 to 1 tablet) by mouth 3 times daily as needed Melatonin 10 mg at bedtime as needed Buspirone  7.5 mg 2 times daily  Patient presents to the encounter stating that he has been spending more time with his friend and has been going outside of the house more.  He reports that he recently went to his primary care provider to address his blood pressure and was recently placed on olmesartan .  Patient also reports that his general stressors have decreased.  Patient continues to take his medications regularly and denies experiencing any adverse side effects.  Patient reports that things in his life have gotten better when not around his family.  In regards to his use of Strattera , patient reports that he will always take 40 mg in the morning, but on occasion, he will not always take his second dose in the afternoon.  Patient feels that he is on too many medications and is interested in decreasing the dosage of his buspirone  and weaning off clonidine .  Patient denies overt depressive symptoms stating that he tries to be more in the moment.  He reports that since being around more people, his depression has not been as bad.   Patient endorses anxiety stating that his anxiety is almost always present when he stutters.  Patient attributes his anxiety to road rage or minute stressors in his life.  A PHQ-9 screen was performed with the patient scoring a 0.  A GAD-7 screen was also performed with the patient scoring a 2.  Patient is alert and oriented x 4, pleasant, calm, cooperative, and fully engaged in conversation during the encounter.  Patient endorses joyful mood.  Patient exhibits euthymic mood with appropriate affect.  Patient denies suicidal or homicidal ideations.  He further denies auditory or visual hallucinations and does not appear to be responding to internal/external stimuli.  Patient endorses good sleep and receives on average 7 to 8 hours of sleep per night.  Patient endorses good appetite and eats on average 3-4 meals per day.  Patient endorses moderate alcohol  consumption but states that he has a handle on it.  Patient endorses tobacco use and smokes on average less than 1 cigarette/day.  Patient denies illicit drug use  Visit Diagnosis:    ICD-10-CM   1. Attention deficit hyperactivity disorder (ADHD), combined type  F90.2 cloNIDine  (CATAPRES ) 0.1 MG tablet    buPROPion  (WELLBUTRIN  XL) 300 MG 24 hr tablet    atomoxetine  (STRATTERA ) 40 MG capsule    2. Generalized anxiety disorder  F41.1 cloNIDine  (CATAPRES ) 0.1 MG tablet    busPIRone  (BUSPAR ) 5 MG tablet    3. Moderate episode of recurrent major depressive disorder (HCC)  F33.1 buPROPion  (WELLBUTRIN  XL) 300 MG 24 hr tablet      Past Psychiatric History:  Patient has a past  [place] history significant for attention deficit hyperactivity disorder, major depressive disorder, PTSD, polysubstance abuse, and generalized anxiety disorder   Patient reports that he has a past history of hospitalization due to mental health.  Patient reports that the hospitalization occurred around the time he was 34 when he had moved to [place].  Patient reports that he is  originally from a [address] town and once he moved to [place], he wanted to leave and by experimenting with drugs.  Patient states that he grew up sheltered and once he moved to [place], he states that he rebelled against the environment he grew up in.   Patient denies a past history of suicide attempt but states that he has had suicidal thoughts in the past   Patient denies a past history of homicide  Past Medical History:  Past Medical History:  Diagnosis Date   ADD (attention deficit disorder)    Allergy    Anxiety    Depression    Hemorrhoids 02/03/2015   High risk sexual behavior 02/03/2015   History of admission to inpatient psychiatry department 03/09/2022   History of non-suicidal self-harm 03/09/2022   Hypertension    Sleep apnea    Sleep disorder 06/02/2015   Substance abuse (HCC)    Tobacco use disorder 06/02/2015    Past Surgical History:  Procedure Laterality Date   TONSILLECTOMY     TYMPANOSTOMY TUBE PLACEMENT     WISDOM TOOTH EXTRACTION      Family Psychiatric History:  Patient reports that mental health runs on his father's side of the family.  Patient reports that PTSD and generalized anxiety are common on his father's side of the family   Father - Generalized anxiety disorder and major depressive disorder Grandfather (paternal) - Generalized anxiety disorder and major depressive disorder.  Patient reports that his grandfather had a past psychiatric history of paranoid schizophrenia and spent extended periods of time in the hospital   Family history of suicide attempt: Patient denies Family history of homicide: Patient denies Family history of substance abuse: Patient reports that his cousins on his mother's side of the family all struggle with substance abuse  Family History:  Family History  Problem Relation Age of Onset   Allergies Mother    Hyperlipidemia Mother    Diabetes Mother    Cancer Mother        Endometrial   Arthritis Mother     Varicose Veins Mother    Allergies Father    Hypertension Father    Stroke Father    Depression Father    Hyperlipidemia Father    Anxiety disorder Father    Arthritis Father    Cancer Father        Skin, lymphoma   Heart disease Father    Vision loss Father    Allergies Sister    Hyperlipidemia Sister    ADD / ADHD Sister    Varicose Veins Sister    Alcohol  abuse Maternal Aunt    Cancer Maternal Aunt        Skin   Miscarriages / Stillbirths Maternal Aunt    Arthritis Maternal Grandmother    Cancer Maternal Grandmother        Glioblastoma   Cancer Maternal Grandfather        Prostate   Cancer Paternal Grandmother        Breast   Dementia Paternal Grandmother    Anxiety disorder Paternal Grandfather    Depression Paternal Grandfather    Heart disease Paternal Grandfather  Hypertension Paternal Grandfather    Schizophrenia Paternal Grandfather     Social History:  Social History   Socioeconomic History   Marital status: Single    Spouse name: Not on file   Number of children: 0   Years of education: Not on file   Highest education level: Associate degree: occupational, Scientist, product/process development, or vocational program  Occupational History   Occupation: Self-employed    Comment: Variety of jobs when available.  Tobacco Use   Smoking status: Every Day    Current packs/day: 0.00    Average packs/day: 0.3 packs/day for 17.0 years (4.3 ttl pk-yrs)    Types: Cigarettes    Last attempt to quit: 01/30/2015    Years since quitting: 8.9   Smokeless tobacco: Not on file   Tobacco comments:    About 2/day  Vaping Use   Vaping status: Never Used  Substance and Sexual Activity   Alcohol  use: Not Currently   Drug use: Not Currently    Types: Amphetamines, Benzodiazepines, Cocaine, Codeine, Hydrocodone, Marijuana, MDMA (Ecstacy), Methylphenidate, Oxycodone, Psilocybin   Sexual activity: Not Currently    Birth control/protection: None  Other Topics Concern   Not on file  Social  History Narrative   Not on file   Social Drivers of Health   Financial Resource Strain: High Risk (10/03/2023)   Overall Financial Resource Strain (CARDIA)    Difficulty of Paying Living Expenses: Very hard  Food Insecurity: Food Insecurity Present (10/03/2023)   Hunger Vital Sign    Worried About Running Out of Food in the Last Year: Never true    Ran Out of Food in the Last Year: Sometimes true  Transportation Needs: No Transportation Needs (10/03/2023)   PRAPARE - Administrator, Civil Service (Medical): No    Lack of Transportation (Non-Medical): No  Physical Activity: Sufficiently Active (10/03/2023)   Exercise Vital Sign    Days of Exercise per Week: 7 days    Minutes of Exercise per Session: 120 min  Stress: No Stress Concern Present (10/03/2023)   [employer] of Occupational Health - Occupational Stress Questionnaire    Feeling of Stress: Only a little  Social Connections: Moderately Isolated (10/03/2023)   Social Connection and Isolation Panel    Frequency of Communication with Friends and Family: More than three times a week    Frequency of Social Gatherings with Friends and Family: Twice a week    Attends Religious Services: 1 to 4 times per year    Active Member of [employer] or Organizations: No    Attends Engineer, structural: Not on file    Marital Status: Never married    Allergies:  Allergies  Allergen Reactions   Amoxicillin Nausea And Vomiting   Penicillins Nausea And Vomiting    Metabolic Disorder Labs: Lab Results  Component Value Date   HGBA1C 5.3 03/09/2022   MPG 105.41 03/09/2022   No results found for: PROLACTIN Lab Results  Component Value Date   CHOL 192 03/09/2022   TRIG 104 03/09/2022   HDL 73 03/09/2022   CHOLHDL 2.6 03/09/2022   VLDL 21 03/09/2022   LDLCALC 98 03/09/2022   LDLCALC 120 04/23/2015   Lab Results  Component Value Date   TSH 2.08 10/04/2023   TSH 2.539 03/09/2022    Therapeutic Level Labs: No  results found for: LITHIUM No results found for: VALPROATE No results found for: CBMZ  Current Medications: Current Outpatient Medications  Medication Sig Dispense Refill   acetaminophen  (TYLENOL ) 325  MG tablet Take 650 mg by mouth every 6 (six) hours as needed.     atomoxetine  (STRATTERA ) 40 MG capsule Take 1 capsule (40 mg total) by mouth 2 (two) times daily with a meal. 60 capsule 2   buPROPion  (WELLBUTRIN  XL) 300 MG 24 hr tablet Take 1 tablet (300 mg total) by mouth every morning. 30 tablet 2   busPIRone  (BUSPAR ) 5 MG tablet Take 1 tablet (5 mg total) by mouth 2 (two) times daily. 60 tablet 2   cetirizine  (ZYRTEC  ALLERGY) 10 MG tablet Take 1 tablet (10 mg total) by mouth daily. 30 tablet 1   cloNIDine  (CATAPRES ) 0.1 MG tablet Take 0.5 tablets (0.05 mg total) by mouth daily. 15 tablet 2   loratadine (CLARITIN) 10 MG tablet Take 10 mg by mouth daily as needed for allergies.     Melatonin 10 MG TABS Take 10 mg by mouth at bedtime as needed. 30 tablet 3   olmesartan  (BENICAR ) 20 MG tablet Take 1 tablet (20 mg total) by mouth daily. 90 tablet 3   No current facility-administered medications for this visit.     Musculoskeletal: Strength & Muscle Tone: within normal limits Gait & Station: normal Patient leans: N/A  Psychiatric Specialty Exam: Review of Systems  Psychiatric/Behavioral:  Negative for decreased concentration, dysphoric mood, hallucinations, self-injury, sleep disturbance and suicidal ideas. The patient is not nervous/anxious and is not hyperactive.     Blood pressure 116/86, pulse (!) 119, temperature 98.2 F (36.8 C), temperature source Oral, height 5' 7 (1.702 m), weight 164 lb 12.8 oz (74.8 kg), SpO2 99%.Body mass index is 25.81 kg/m.  General Appearance: Casual  Eye Contact:  Good  Speech:  Clear and Coherent and Normal Rate  Volume:  Normal  Mood:  Euthymic  Affect:  Appropriate  Thought Process:  Coherent, Goal Directed, and Descriptions of Associations:  Intact  Orientation:  Full (Time, Place, and Person)  Thought Content: WDL   Suicidal Thoughts:  No  Homicidal Thoughts:  No  Memory:  Immediate;   Good Recent;   Good Remote;   Good  Judgement:  Good  Insight:  Good  Psychomotor Activity:  Normal  Concentration:  Concentration: Good and Attention Span: Good  Recall:  Good  Fund of Knowledge: Good  Language: Good  Akathisia:  No  Handed:  Right  AIMS (if indicated): not done  Assets:  Communication Skills Desire for Improvement Housing Resilience Social Support Talents/Skills Vocational/Educational  ADL's:  Intact  Cognition: WNL  Sleep:  Good   Screenings: AIMS    Flowsheet Row Clinical Support from 09/28/2023 in [facility]  AIMS Total Score 0   AUDIT    Flowsheet Row Office Visit from 10/04/2023 in [facility]  Alcohol  Use Disorder Identification Test Final Score (AUDIT) 13    GAD-7    Flowsheet Row Clinical Support from 12/28/2023 in [facility] Clinical Support from 08/03/2023 in [facility] Counselor from 07/04/2023 in [facility] Video Visit from 06/01/2023 in [facility] Clinical Support from 04/06/2023 in [facility]  Total GAD-7 Score 2 10 4 4 8    PHQ2-9    Flowsheet Row Clinical Support from 12/28/2023 in [facility] Clinical Support from 09/28/2023 in [facility] Clinical Support from 08/03/2023 in [facility] Counselor from 07/04/2023 in [facility] Video  Visit from 06/01/2023 in [facility]  PHQ-2 Total Score 0 0 2 2 1   PHQ-9 Total Score -- 1 6 2  --   Flowsheet Row Clinical Support from 12/28/2023 in [facility] Clinical Support from  09/28/2023 in [facility] Clinical Support from 08/03/2023 in [facility]  C-SSRS RISK CATEGORY Moderate Risk Moderate Risk Moderate Risk     Assessment and Plan:   Patrick Alexander is a 38 year old, Caucasian male with a past psychiatric history significant for attention deficit hyperactivity disorder (combined type), alcohol  use disorder (severe/dependence, in early remission), generalized anxiety disorder, chronic PTSD, and major depressive disorder who presents to [facility] for follow-up and medication management.  Patient presents to the encounter stating that he has been taking his medications regularly.  On occasion, patient reports that he will occasionally not take his second dose of atomoxetine .  Patient also feels that he is taking too many medications and would like to adjust his current regimen.  In regards to his mood, patient reports that his mood has vastly improved since taking his medications.  He also endorses improved mood due to not being around his family as much.  Patient denies overt depressive symptoms but states that he continues to experience some anxiety when stuttering or due to road rage.  A PHQ-9 screen was performed with the patient scoring a 0.  A GAD-7 screen was also performed with the patient scoring a 2.  Patient request for his clonidine  to be adjusted from 0.1 mg (0.5 to 1 tablet) 3 times daily as needed to 0.05 mg daily.  Patient also recommended decreasing his current dosage of buspirone  from 7.5 mg 2 times daily to 5 mg 2 times daily.  Patient was agreeable to recommendations.  Patient to continue taking all other medications as prescribed.  Patient's medications to be e-prescribed to his pharmacy of choice.  A [place] Suicide Severity Rating Scale was performed with the patient being considered moderate risk.  Patient denies suicidal ideations and is able to contract  for safety following the conclusion of the encounter.  Safety planning was discussed with the patient prior to the conclusion of the encounter: - Patient was instructed to contact 911 in the event of a mental health crisis - Patient was instructed to contact [facility] in the event of a mental health crisis - Patient was instructed to present to [facility] in the event of a mental health crisis  Collaboration of Care: Collaboration of Care: Medication Management AEB provider managing patient's psychiatric medications and Psychiatrist AEB patient being followed by mental health provider at this facility  Patient/Guardian was advised Release of Information must be obtained prior to any record release in order to collaborate their care with an outside provider. Patient/Guardian was advised if they have not already done so to contact the registration department to sign all necessary forms in order for us  to release information regarding their care.   Consent: Patient/Guardian gives verbal consent for treatment and assignment of benefits for services provided during this visit. Patient/Guardian expressed understanding and agreed to proceed.   1. Attention deficit hyperactivity disorder (ADHD), combined type  - cloNIDine  (CATAPRES ) 0.1 MG tablet; Take 0.5 tablets (0.05 mg total) by mouth daily.  Dispense: 15 tablet; Refill: 2 - buPROPion  (WELLBUTRIN  XL) 300 MG 24 hr tablet; Take 1 tablet (300 mg total) by mouth every morning.  Dispense: 30 tablet; Refill: 2 - atomoxetine  (STRATTERA ) 40 MG capsule; Take 1 capsule (40 mg total) by mouth 2 (two) times daily with a meal.  Dispense: 60 capsule; Refill: 2  2. Generalized anxiety disorder  - cloNIDine  (CATAPRES ) 0.1 MG tablet; Take 0.5 tablets (0.05 mg total) by mouth daily.  Dispense: 15 tablet; Refill: 2 - busPIRone  (BUSPAR ) 5 MG tablet; Take 1 tablet (5 mg total) by mouth 2 (two) times daily.   Dispense: 60 tablet; Refill: 2  3. Moderate episode of recurrent major depressive disorder (HCC)  - buPROPion  (WELLBUTRIN  XL) 300 MG 24 hr tablet; Take 1 tablet (300 mg total) by mouth every morning.  Dispense: 30 tablet; Refill: 2  Patient to follow-up in 2 months Provider spent a total of 19 minutes with the patient/reviewing patient's chart  Reginia FORBES Bolster, PA 12/28/2023, 1:00 PM

## 2024-01-05 ENCOUNTER — Other Ambulatory Visit: Payer: Self-pay

## 2024-01-06 ENCOUNTER — Other Ambulatory Visit: Payer: Self-pay

## 2024-01-09 ENCOUNTER — Telehealth (HOSPITAL_COMMUNITY): Payer: Self-pay

## 2024-01-09 ENCOUNTER — Other Ambulatory Visit: Payer: Self-pay

## 2024-01-09 NOTE — Telephone Encounter (Signed)
 received fax that a prior auth was needed for the atomaxetine hcl 40mg . pt last seen on 9-10 next appt 10-28

## 2024-01-09 NOTE — Telephone Encounter (Signed)
 went online to [email or web address] and submitted the prior auth . - pending

## 2024-01-09 NOTE — Telephone Encounter (Signed)
 called pharmacy - explained that pa was denied but that it is a preferred medication of medicaid. the pharmacist states that she was able to process under medicaid and it was $4.00.

## 2024-01-09 NOTE — Telephone Encounter (Signed)
 received notice that prior auth was denied (did not give any reason)  this medication is a preferred medication for medicaid will call the pharmacy

## 2024-01-10 ENCOUNTER — Other Ambulatory Visit: Payer: Self-pay

## 2024-01-27 ENCOUNTER — Other Ambulatory Visit: Payer: Self-pay | Admitting: Medical Genetics

## 2024-01-27 DIAGNOSIS — Z006 Encounter for examination for normal comparison and control in clinical research program: Secondary | ICD-10-CM

## 2024-02-06 ENCOUNTER — Other Ambulatory Visit: Payer: Self-pay

## 2024-02-09 ENCOUNTER — Other Ambulatory Visit: Payer: Self-pay

## 2024-02-13 ENCOUNTER — Encounter: Payer: MEDICAID | Admitting: Family Medicine

## 2024-02-14 ENCOUNTER — Encounter: Payer: Self-pay | Admitting: Family Medicine

## 2024-02-14 ENCOUNTER — Other Ambulatory Visit: Payer: Self-pay

## 2024-02-14 ENCOUNTER — Ambulatory Visit (INDEPENDENT_AMBULATORY_CARE_PROVIDER_SITE_OTHER): Payer: MEDICAID | Admitting: Physician Assistant

## 2024-02-14 ENCOUNTER — Ambulatory Visit: Payer: MEDICAID | Admitting: Family Medicine

## 2024-02-14 ENCOUNTER — Encounter (HOSPITAL_COMMUNITY): Payer: Self-pay | Admitting: Physician Assistant

## 2024-02-14 VITALS — BP 126/80 | HR 110 | Temp 98.1°F | Ht 67.0 in | Wt 165.4 lb

## 2024-02-14 DIAGNOSIS — F17200 Nicotine dependence, unspecified, uncomplicated: Secondary | ICD-10-CM

## 2024-02-14 DIAGNOSIS — F411 Generalized anxiety disorder: Secondary | ICD-10-CM

## 2024-02-14 DIAGNOSIS — F331 Major depressive disorder, recurrent, moderate: Secondary | ICD-10-CM

## 2024-02-14 DIAGNOSIS — I10 Essential (primary) hypertension: Secondary | ICD-10-CM | POA: Diagnosis not present

## 2024-02-14 DIAGNOSIS — F902 Attention-deficit hyperactivity disorder, combined type: Secondary | ICD-10-CM

## 2024-02-14 DIAGNOSIS — Z23 Encounter for immunization: Secondary | ICD-10-CM | POA: Diagnosis not present

## 2024-02-14 DIAGNOSIS — Z0000 Encounter for general adult medical examination without abnormal findings: Secondary | ICD-10-CM | POA: Diagnosis not present

## 2024-02-14 MED ORDER — BUPROPION HCL ER (XL) 300 MG PO TB24
300.0000 mg | ORAL_TABLET | ORAL | 1 refills | Status: DC
  Filled 2024-02-14 – 2024-03-05 (×2): qty 30, 30d supply, fill #0

## 2024-02-14 MED ORDER — BUSPIRONE HCL 5 MG PO TABS
5.0000 mg | ORAL_TABLET | Freq: Two times a day (BID) | ORAL | 1 refills | Status: DC
  Filled 2024-02-14: qty 60, 30d supply, fill #0
  Filled 2024-03-10: qty 60, 30d supply, fill #1

## 2024-02-14 MED ORDER — CLONIDINE HCL 0.1 MG PO TABS
0.0500 mg | ORAL_TABLET | Freq: Every day | ORAL | 1 refills | Status: DC
  Filled 2024-02-14: qty 15, 30d supply, fill #0
  Filled 2024-03-10: qty 15, 30d supply, fill #1

## 2024-02-14 MED ORDER — SERTRALINE HCL 50 MG PO TABS
50.0000 mg | ORAL_TABLET | Freq: Every day | ORAL | 1 refills | Status: DC
  Filled 2024-02-14: qty 30, 30d supply, fill #0
  Filled 2024-03-09: qty 30, 30d supply, fill #1

## 2024-02-14 MED ORDER — ATOMOXETINE HCL 40 MG PO CAPS
40.0000 mg | ORAL_CAPSULE | Freq: Two times a day (BID) | ORAL | 1 refills | Status: DC
  Filled 2024-02-14 – 2024-03-05 (×2): qty 60, 30d supply, fill #0

## 2024-02-14 NOTE — Assessment & Plan Note (Signed)
 BP is at goal. Continue olmesartan  20 mg daily.

## 2024-02-14 NOTE — Assessment & Plan Note (Signed)
 Overall health is very good. Recommend ongoing regular exercise. Discussed recommended screenings and immunizations.

## 2024-02-14 NOTE — Assessment & Plan Note (Signed)
 Currently smoking < 1 cigarette a day. I recommend that he stop tobacco use.

## 2024-02-14 NOTE — Progress Notes (Signed)
 [facility] [address] [place] [address] Dept: [phone] Dept Fax: [phone]  Annual Physical Visit  Subjective:    Patient ID: Patrick Alexander, male    DOB: Sep 15, 1985, 38 y.o..   MRN: [id number]  Chief Complaint  Patient presents with   Annual Exam    CPE/labs.  Fasting today.  no concerns.    History of Present Illness:  Patient is in today for an annual physical/preventative visit.  Mr. Mcgregor has a recent history of hypertension. He is managed on olmesartan  20 mg daily. He had been on clonidine  0.1 mg 1/2-1 tab TID as needed related to his other BH issues, but notes he is tapering off of this. He smokes an occasional cigarette. he feels his bupropion  has reduced his desire for this.  Mr. Stankowski has a history of alcohol  dependence. He remains sober for > 1 year. He is engaged in regular counseling.    Mr. Mamaril has a history of chronic depression. He is managed on bupropion  XL 300 mg daily. He is tapering off of clonidine . He is no longer taking buspirone . He notes this has been stable and his mood is doing okay.   Mr. Brass has a history of ADHD. He is managed on atomoxetine  40 mg daily.  Review of Systems  Constitutional:  Negative for chills, diaphoresis, fever, malaise/fatigue and weight loss.  HENT:  Negative for congestion, ear pain, hearing loss, sinus pain, sore throat and tinnitus.   Eyes:  Negative for blurred vision, pain, discharge and redness.  Respiratory:  Negative for cough, shortness of breath and wheezing.   Cardiovascular:  Negative for chest pain and palpitations.  Gastrointestinal:  Negative for abdominal pain, constipation, diarrhea, heartburn, nausea and vomiting.  Musculoskeletal:  Negative for back pain, joint pain and myalgias.  Skin:  Negative for itching and rash.  Psychiatric/Behavioral:  Positive for depression. The patient is not nervous/anxious.        As noted above.    Past Medical History: Patient Active Problem List   Diagnosis Date Noted   Essential hypertension 11/15/2023   History of syphilis 02/07/2023   GAD (generalized anxiety disorder) 04/22/2022   Psychophysiological insomnia 03/30/2022   Marijuana abuse 03/09/2022   PTSD (post-traumatic stress disorder) 03/09/2022   Eating disorder, unspecified 03/09/2022   MDD (major depressive disorder), severe (HCC) 03/09/2022   Attention deficit hyperactivity disorder (ADHD) 03/09/2022   History of admission to inpatient psychiatry department 03/09/2022   Alcohol  use disorder, severe, in sustained remission, dependence (HCC) 03/08/2022   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   Seasonal allergic rhinitis due to pollen 08/19/2015   Positive ANA (antinuclear antibody) 08/19/2015   OSA on CPAP 08/19/2015   Tobacco use disorder 06/02/2015   Obsessive-compulsive disorder, unspecified 08/05/2012   Past Surgical History:  Procedure Laterality Date   TONSILLECTOMY     TYMPANOSTOMY TUBE PLACEMENT     WISDOM TOOTH EXTRACTION     Family History  Problem Relation Age of Onset   Allergies Mother    Hyperlipidemia Mother    Diabetes Mother    Cancer Mother        Endometrial   Arthritis Mother    Varicose Veins Mother    Allergies Father    Hypertension Father    Stroke Father    Depression Father    Hyperlipidemia Father    Anxiety disorder Father    Arthritis Father    Cancer Father  Skin, lymphoma   Heart disease Father    Vision loss Father    Allergies Sister    Hyperlipidemia Sister    ADD / ADHD Sister    Varicose Veins Sister    Alcohol  abuse Maternal Aunt    Cancer Maternal Aunt        Skin   Miscarriages / Stillbirths Maternal Aunt    Arthritis Maternal Grandmother    Cancer Maternal Grandmother        Glioblastoma   Cancer Maternal Grandfather        Prostate   Cancer Paternal Grandmother        Breast   Dementia Paternal Grandmother    Anxiety disorder  Paternal Grandfather    Depression Paternal Grandfather    Heart disease Paternal Grandfather    Hypertension Paternal Grandfather    Schizophrenia Paternal Grandfather    Outpatient Medications Prior to Visit  Medication Sig Dispense Refill   acetaminophen  (TYLENOL ) 325 MG tablet Take 650 mg by mouth every 6 (six) hours as needed.     atomoxetine  (STRATTERA ) 40 MG capsule Take 1 capsule (40 mg total) by mouth 2 (two) times daily with a meal. 60 capsule 2   buPROPion  (WELLBUTRIN  XL) 300 MG 24 hr tablet Take 1 tablet (300 mg total) by mouth every morning. 30 tablet 2   busPIRone  (BUSPAR ) 5 MG tablet Take 1 tablet (5 mg total) by mouth 2 (two) times daily. 60 tablet 2   cetirizine  (ZYRTEC  ALLERGY) 10 MG tablet Take 1 tablet (10 mg total) by mouth daily. 30 tablet 1   cloNIDine  (CATAPRES ) 0.1 MG tablet Take 0.5 tablets (0.05 mg total) by mouth daily. 15 tablet 2   loratadine (CLARITIN) 10 MG tablet Take 10 mg by mouth daily as needed for allergies.     Melatonin 10 MG TABS Take 10 mg by mouth at bedtime as needed. 30 tablet 3   olmesartan  (BENICAR ) 20 MG tablet Take 1 tablet (20 mg total) by mouth daily. 90 tablet 3   No facility-administered medications prior to visit.   Allergies  Allergen Reactions   Amoxicillin Nausea And Vomiting   Penicillins Nausea And Vomiting   Objective:   Today's Vitals   02/14/24 1000  BP: 126/80  Pulse: (!) 110  Temp: 98.1 F (36.7 C)  TempSrc: Temporal  SpO2: 93%  Weight: 165 lb 6.4 oz (75 kg)  Height: 5' 7 (1.702 m)   Body mass index is 25.91 kg/m.   General: Well developed, well nourished. No acute distress. HEENT: Normocephalic, non-traumatic. PERRL, EOMI. Conjunctiva clear. External ears normal. EAC and TMs normal bilaterally.   Nose clear without congestion or rhinorrhea. Mucous membranes moist. Oropharynx clear. Good dentition. Neck: Supple. No lymphadenopathy. No thyromegaly. Lungs: Clear to auscultation bilaterally. No wheezing, rales  or rhonchi. CV: RRR without murmurs or rubs. Pulses 2+ bilaterally. Abdomen: Soft, non-tender. Bowel sounds positive, normal pitch and frequency. No hepatosplenomegaly. No rebound or guarding. Extremities: Full ROM. No joint swelling or tenderness. No edema noted. Skin: Warm and dry. No rashes. Psych: Alert and oriented. Normal mood and affect.  Health Maintenance Due  Topic Date Due   HPV VACCINES (1 - 3-dose SCDM series) Never done   Influenza Vaccine  11/18/2023     Assessment & Plan:   Problem List Items Addressed This Visit       Cardiovascular and Mediastinum   Essential hypertension   BP is at goal. Continue olmesartan  20 mg daily.  Other   Annual physical exam - Primary   Overall health is very good. Recommend ongoing regular exercise. Discussed recommended screenings and immunizations.       Tobacco use disorder   Currently smoking < 1 cigarette a day. I recommend that he stop tobacco use.      Other Visit Diagnoses       Need for immunization against influenza       Relevant Orders   Flu vaccine trivalent PF, 6mos and older(Flulaval,Afluria,Fluarix,Fluzone) (Completed)       Return in about 4 months (around 06/16/2024) for Reassessment.   Garnette CHRISTELLA Simpler, MD

## 2024-02-14 NOTE — Progress Notes (Signed)
 BH MD/PA/NP OP Progress Note  02/14/2024 1:30 PM [name]  MRN:  [id number]  Chief Complaint:  Chief Complaint  Patient presents with   Follow-up   Medication Management   HPI:   Patrick Alexander is a 38 year old, Caucasian male with a past psychiatric history significant for attention deficit hyperactivity disorder (combined type), alcohol  use disorder (severe/dependence, in early remission), generalized anxiety disorder, chronic PTSD, and major depressive disorder who presents to [facility] for follow-up and medication management.  Patient is currently being managed on the following psychiatric medications:  Strattera  40 mg 2 times daily Naltrexone  50 mg daily Bupropion  (Wellbutrin  XL) 300 mg 24-hour tablet daily Clonidine  0.1 mg (0.5 to 1 tablet) by mouth 3 times daily as needed Buspirone  5 mg 2 times daily  ***  Patient is alert and oriented x 4, pleasant, calm, cooperative, and fully engaged in conversation during the encounter. ***  Visit Diagnosis:    ICD-10-CM   1. Attention deficit hyperactivity disorder (ADHD), combined type  F90.2 cloNIDine  (CATAPRES ) 0.1 MG tablet    atomoxetine  (STRATTERA ) 40 MG capsule    buPROPion  (WELLBUTRIN  XL) 300 MG 24 hr tablet    2. Generalized anxiety disorder  F41.1 cloNIDine  (CATAPRES ) 0.1 MG tablet    busPIRone  (BUSPAR ) 5 MG tablet    sertraline  (ZOLOFT ) 50 MG tablet    3. Moderate episode of recurrent major depressive disorder (HCC)  F33.1 buPROPion  (WELLBUTRIN  XL) 300 MG 24 hr tablet    sertraline  (ZOLOFT ) 50 MG tablet       Past Psychiatric History:  Patient has a past Patrick history significant for attention deficit hyperactivity disorder, major depressive disorder, PTSD, polysubstance abuse, and generalized anxiety disorder   Patient reports that he has a past history of hospitalization due to mental health.  Patient reports that the hospitalization occurred around the time he was  69 when he had moved to [place].  Patient reports that he is originally from [place] [address] town and once he moved to [place], he wanted to leave and by experimenting with drugs.  Patient states that he grew up sheltered and once he moved to [place], he states that he rebelled against the environment he grew up in.   Patient denies a past history of suicide attempt but states that he has had suicidal thoughts in the past   Patient denies a past history of homicide  Past Medical History:  Past Medical History:  Diagnosis Date   ADD (attention deficit disorder)    Allergy    Annual physical exam 02/14/2024   Anxiety    Depression    Hemorrhoids 02/03/2015   High risk sexual behavior 02/03/2015   History of admission to inpatient psychiatry department 03/09/2022   History of non-suicidal self-harm 03/09/2022   Hypertension    Sleep apnea    Sleep disorder 06/02/2015   Substance abuse (HCC)    Tobacco use disorder 06/02/2015    Past Surgical History:  Procedure Laterality Date   TONSILLECTOMY     TYMPANOSTOMY TUBE PLACEMENT     WISDOM TOOTH EXTRACTION      Family Psychiatric History:  Patient reports that mental health runs on his father's side of the family.  Patient reports that PTSD and generalized anxiety are common on his father's side of the family   Father - Generalized anxiety disorder and major depressive disorder Grandfather (paternal) - Generalized anxiety disorder and major depressive disorder.  Patient reports that his grandfather had a past psychiatric history  of paranoid schizophrenia and spent extended periods of time in the hospital   Family history of suicide attempt: Patient denies Family history of homicide: Patient denies Family history of substance abuse: Patient reports that his cousins on his mother's side of the family all struggle with substance abuse  Family History:  Family History  Problem Relation Age of Onset   Allergies Mother     Hyperlipidemia Mother    Diabetes Mother    Cancer Mother        Endometrial   Arthritis Mother    Varicose Veins Mother    Allergies Father    Hypertension Father    Stroke Father    Depression Father    Hyperlipidemia Father    Anxiety disorder Father    Arthritis Father    Cancer Father        Skin, lymphoma   Heart disease Father    Vision loss Father    Allergies Sister    Hyperlipidemia Sister    ADD / ADHD Sister    Varicose Veins Sister    Alcohol  abuse Maternal Aunt    Cancer Maternal Aunt        Skin   Miscarriages / Stillbirths Maternal Aunt    Arthritis Maternal Grandmother    Cancer Maternal Grandmother        Glioblastoma   Cancer Maternal Grandfather        Prostate   Cancer Paternal Grandmother        Breast   Dementia Paternal Grandmother    Anxiety disorder Paternal Grandfather    Depression Paternal Grandfather    Heart disease Paternal Grandfather    Hypertension Paternal Grandfather    Schizophrenia Paternal Grandfather     Social History:  Social History   Socioeconomic History   Marital status: Single    Spouse name: Not on file   Number of children: 0   Years of education: Not on file   Highest education level: Associate degree: occupational, scientist, product/process development, or vocational program  Occupational History   Occupation: Self-employed    Comment: Variety of jobs when available.  Tobacco Use   Smoking status: Every Day    Current packs/day: 0.00    Average packs/day: 0.3 packs/day for 17.0 years (4.3 ttl pk-yrs)    Types: Cigarettes    Last attempt to quit: 01/30/2015    Years since quitting: 9.0   Smokeless tobacco: Not on file   Tobacco comments:    About 2/day  Vaping Use   Vaping status: Never Used  Substance and Sexual Activity   Alcohol  use: Not Currently   Drug use: Not Currently    Types: Amphetamines, Benzodiazepines, Cocaine, Codeine, Hydrocodone, Marijuana, MDMA (Ecstacy), Methylphenidate, Oxycodone, Psilocybin   Sexual  activity: Not Currently    Birth control/protection: None  Other Topics Concern   Not on file  Social History Narrative   Not on file   Social Drivers of Health   Financial Resource Strain: Low Risk  (02/13/2024)   Overall Financial Resource Strain (CARDIA)    Difficulty of Paying Living Expenses: Not very hard  Food Insecurity: No Food Insecurity (02/13/2024)   Hunger Vital Sign    Worried About Running Out of Food in the Last Year: Never true    Ran Out of Food in the Last Year: Never true  Transportation Needs: No Transportation Needs (02/13/2024)   PRAPARE - Administrator, Civil Service (Medical): No    Lack of Transportation (Non-Medical): No  Physical Activity: Sufficiently Active (02/13/2024)   Exercise Vital Sign    Days of Exercise per Week: 3 days    Minutes of Exercise per Session: 60 min  Stress: Stress Concern Present (02/13/2024)   [employer] of Occupational Health - Occupational Stress Questionnaire    Feeling of Stress: To some extent  Social Connections: Unknown (02/13/2024)   Social Connection and Isolation Panel    Frequency of Communication with Friends and Family: Three times a week    Frequency of Social Gatherings with Friends and Family: Once a week    Attends Religious Services: Patient declined    Database Administrator or Organizations: No    Attends Engineer, Structural: Not on file    Marital Status: Patient declined    Allergies:  Allergies  Allergen Reactions   Amoxicillin Nausea And Vomiting   Penicillins Nausea And Vomiting    Metabolic Disorder Labs: Lab Results  Component Value Date   HGBA1C 5.3 03/09/2022   MPG 105.41 03/09/2022   No results found for: PROLACTIN Lab Results  Component Value Date   CHOL 192 03/09/2022   TRIG 104 03/09/2022   HDL 73 03/09/2022   CHOLHDL 2.6 03/09/2022   VLDL 21 03/09/2022   LDLCALC 98 03/09/2022   LDLCALC 120 04/23/2015   Lab Results  Component Value Date    TSH 2.08 10/04/2023   TSH 2.539 03/09/2022    Therapeutic Level Labs: No results found for: LITHIUM No results found for: VALPROATE No results found for: CBMZ  Current Medications: Current Outpatient Medications  Medication Sig Dispense Refill   sertraline  (ZOLOFT ) 50 MG tablet Take 1 tablet (50 mg total) by mouth daily. 30 tablet 1   acetaminophen  (TYLENOL ) 325 MG tablet Take 650 mg by mouth every 6 (six) hours as needed.     atomoxetine  (STRATTERA ) 40 MG capsule Take 1 capsule (40 mg total) by mouth 2 (two) times daily with a meal. 60 capsule 1   buPROPion  (WELLBUTRIN  XL) 300 MG 24 hr tablet Take 1 tablet (300 mg total) by mouth every morning. 30 tablet 1   busPIRone  (BUSPAR ) 5 MG tablet Take 1 tablet (5 mg total) by mouth 2 (two) times daily. 60 tablet 1   cetirizine  (ZYRTEC  ALLERGY) 10 MG tablet Take 1 tablet (10 mg total) by mouth daily. 30 tablet 1   cloNIDine  (CATAPRES ) 0.1 MG tablet Take 0.5 tablets (0.05 mg total) by mouth daily. 15 tablet 1   loratadine (CLARITIN) 10 MG tablet Take 10 mg by mouth daily as needed for allergies.     Melatonin 10 MG TABS Take 10 mg by mouth at bedtime as needed. 30 tablet 3   olmesartan  (BENICAR ) 20 MG tablet Take 1 tablet (20 mg total) by mouth daily. 90 tablet 3   No current facility-administered medications for this visit.     Musculoskeletal: Strength & Muscle Tone: within normal limits Gait & Station: normal Patient leans: N/A  Psychiatric Specialty Exam: Review of Systems  Psychiatric/Behavioral:  Negative for decreased concentration, dysphoric mood, hallucinations, self-injury, sleep disturbance and suicidal ideas. The patient is nervous/anxious. The patient is not hyperactive.     Blood pressure 134/86, pulse 97, temperature 98.2 F (36.8 C), temperature source Oral, height 5' 7 (1.702 m), weight 165 lb 6.4 oz (75 kg), SpO2 99%.Body mass index is 25.91 kg/m.  General Appearance: Casual  Eye Contact:  Good  Speech:  Clear  and Coherent and Normal Rate  Volume:  Normal  Mood:  Anxious  Affect:  Appropriate  Thought Process:  Coherent, Goal Directed, and Descriptions of Associations: Intact  Orientation:  Full (Time, Place, and Person)  Thought Content: WDL   Suicidal Thoughts:  No  Homicidal Thoughts:  No  Memory:  Immediate;   Good Recent;   Good Remote;   Good  Judgement:  Good  Insight:  Good  Psychomotor Activity:  Normal  Concentration:  Concentration: Good and Attention Span: Good  Recall:  Good  Fund of Knowledge: Good  Language: Good  Akathisia:  No  Handed:  Right  AIMS (if indicated): not done  Assets:  Communication Skills Desire for Improvement Housing Resilience Social Support Talents/Skills Vocational/Educational  ADL's:  Intact  Cognition: WNL  Sleep:  Good   Screenings: AIMS    Flowsheet Row Clinical Support from 09/28/2023 in [facility]  AIMS Total Score 0   AUDIT    Flowsheet Row Office Visit from 10/04/2023 in [facility]  Alcohol  Use Disorder Identification Test Final Score (AUDIT) 13    GAD-7    Flowsheet Row Clinical Support from 02/14/2024 in [facility] Most recent reading at 02/14/2024  2:00 PM Office Visit from 02/14/2024 in [facility] [place] Most recent reading at 02/14/2024 10:05 AM Clinical Support from 12/28/2023 in [facility] Most recent reading at 12/28/2023  1:16 PM Clinical Support from 08/03/2023 in [facility] Most recent reading at 08/03/2023  1:14 PM Counselor from 07/04/2023 in [facility] Most recent reading at 07/04/2023  9:31 AM  Total GAD-7 Score 6 4 2 10 4    PHQ2-9    Flowsheet Row Clinical Support from 02/14/2024 in [facility] Most recent reading at 02/14/2024  2:00 PM Office Visit from 02/14/2024  in [facility] [place] Most recent reading at 02/14/2024 10:05 AM Clinical Support from 12/28/2023 in [facility] Most recent reading at 12/28/2023  1:15 PM Clinical Support from 09/28/2023 in [facility] Most recent reading at 09/28/2023  1:22 PM Clinical Support from 08/03/2023 in [facility] Most recent reading at 08/03/2023  1:12 PM  PHQ-2 Total Score 0 0 0 0 2  PHQ-9 Total Score -- 0 -- 1 6   Flowsheet Row Clinical Support from 02/14/2024 in [facility] Clinical Support from 12/28/2023 in [facility] Clinical Support from 09/28/2023 in [facility]  C-SSRS RISK CATEGORY Moderate Risk Moderate Risk Moderate Risk     Assessment and Plan:   Patrick Alexander is a 38 year old, Caucasian male with a past psychiatric history significant for attention deficit hyperactivity disorder (combined type), alcohol  use disorder (severe/dependence, in early remission), generalized anxiety disorder, chronic PTSD, and major depressive disorder who presents to [facility] for follow-up and medication management. ***  A [place] Suicide Severity Rating Scale was performed with the patient being considered moderate risk.  Patient denies suicidal ideations and is able to contract for safety following the conclusion of the encounter.  Safety planning was discussed with the patient prior to the conclusion of the encounter: - Patient was instructed to contact 911 in the event of a mental health crisis - Patient was instructed to contact [facility] in the event of a mental health crisis - Patient was instructed to  present to [facility] in the event of a mental health crisis  Collaboration of Care: Collaboration of Care: Medication  Management AEB provider managing patient's psychiatric medications and Psychiatrist AEB patient being followed by mental health provider at this facility  Patient/Guardian was advised Release of Information must be obtained prior to any record release in order to collaborate their care with an outside provider. Patient/Guardian was advised if they have not already done so to contact the registration department to sign all necessary forms in order for us  to release information regarding their care.   Consent: Patient/Guardian gives verbal consent for treatment and assignment of benefits for services provided during this visit. Patient/Guardian expressed understanding and agreed to proceed.   1. Attention deficit hyperactivity disorder (ADHD), combined type  - cloNIDine  (CATAPRES ) 0.1 MG tablet; Take 0.5 tablets (0.05 mg total) by mouth daily.  Dispense: 15 tablet; Refill: 1 - atomoxetine  (STRATTERA ) 40 MG capsule; Take 1 capsule (40 mg total) by mouth 2 (two) times daily with a meal.  Dispense: 60 capsule; Refill: 1 - buPROPion  (WELLBUTRIN  XL) 300 MG 24 hr tablet; Take 1 tablet (300 mg total) by mouth every morning.  Dispense: 30 tablet; Refill: 1  2. Generalized anxiety disorder  - cloNIDine  (CATAPRES ) 0.1 MG tablet; Take 0.5 tablets (0.05 mg total) by mouth daily.  Dispense: 15 tablet; Refill: 1 - busPIRone  (BUSPAR ) 5 MG tablet; Take 1 tablet (5 mg total) by mouth 2 (two) times daily.  Dispense: 60 tablet; Refill: 1 - sertraline  (ZOLOFT ) 50 MG tablet; Take 1 tablet (50 mg total) by mouth daily.  Dispense: 30 tablet; Refill: 1  3. Moderate episode of recurrent major depressive disorder (HCC)  - buPROPion  (WELLBUTRIN  XL) 300 MG 24 hr tablet; Take 1 tablet (300 mg total) by mouth every morning.  Dispense: 30 tablet; Refill: 1 - sertraline  (ZOLOFT ) 50 MG tablet; Take 1 tablet (50 mg total) by mouth daily.  Dispense: 30 tablet; Refill: 1  Patient to follow-up in 6 weeks Provider spent a total of  17 minutes with the patient/reviewing patient's chart  Reginia FORBES Bolster, PA 02/14/2024, 1:30 PM

## 2024-02-25 LAB — GENECONNECT MOLECULAR SCREEN: Genetic Analysis Overall Interpretation: NEGATIVE

## 2024-02-29 ENCOUNTER — Other Ambulatory Visit: Payer: Self-pay

## 2024-03-05 ENCOUNTER — Other Ambulatory Visit: Payer: Self-pay

## 2024-03-08 ENCOUNTER — Other Ambulatory Visit: Payer: Self-pay

## 2024-03-27 ENCOUNTER — Other Ambulatory Visit: Payer: Self-pay

## 2024-03-27 ENCOUNTER — Encounter (HOSPITAL_COMMUNITY): Payer: Self-pay | Admitting: Physician Assistant

## 2024-03-27 ENCOUNTER — Telehealth (INDEPENDENT_AMBULATORY_CARE_PROVIDER_SITE_OTHER): Payer: MEDICAID | Admitting: Physician Assistant

## 2024-03-27 DIAGNOSIS — F411 Generalized anxiety disorder: Secondary | ICD-10-CM

## 2024-03-27 DIAGNOSIS — F331 Major depressive disorder, recurrent, moderate: Secondary | ICD-10-CM

## 2024-03-27 DIAGNOSIS — F902 Attention-deficit hyperactivity disorder, combined type: Secondary | ICD-10-CM

## 2024-03-27 MED ORDER — ATOMOXETINE HCL 40 MG PO CAPS
40.0000 mg | ORAL_CAPSULE | Freq: Two times a day (BID) | ORAL | 2 refills | Status: AC
  Filled 2024-03-27 – 2024-04-04 (×2): qty 60, 30d supply, fill #0
  Filled 2024-05-05: qty 60, 30d supply, fill #1

## 2024-03-27 MED ORDER — CLONIDINE HCL 0.1 MG PO TABS
0.0500 mg | ORAL_TABLET | Freq: Every day | ORAL | 2 refills | Status: AC
  Filled 2024-03-27 – 2024-04-10 (×2): qty 15, 30d supply, fill #0
  Filled 2024-05-09: qty 15, 30d supply, fill #1

## 2024-03-27 MED ORDER — BUSPIRONE HCL 5 MG PO TABS
5.0000 mg | ORAL_TABLET | Freq: Two times a day (BID) | ORAL | 2 refills | Status: AC
  Filled 2024-03-27 – 2024-04-10 (×2): qty 60, 30d supply, fill #0
  Filled 2024-05-09: qty 60, 30d supply, fill #1

## 2024-03-27 MED ORDER — BUPROPION HCL ER (XL) 300 MG PO TB24
300.0000 mg | ORAL_TABLET | ORAL | 2 refills | Status: AC
  Filled 2024-03-27 – 2024-04-04 (×2): qty 30, 30d supply, fill #0
  Filled 2024-05-05: qty 30, 30d supply, fill #1

## 2024-03-27 MED ORDER — SERTRALINE HCL 50 MG PO TABS
50.0000 mg | ORAL_TABLET | Freq: Every day | ORAL | 2 refills | Status: AC
  Filled 2024-03-27 – 2024-04-10 (×2): qty 30, 30d supply, fill #0
  Filled 2024-05-09: qty 30, 30d supply, fill #1

## 2024-03-27 NOTE — Progress Notes (Addendum)
 BH MD/PA/NP OP Progress Note  Virtual Visit via Video Note  I connected with Patrick Alexander on 03/27/24 at  2:00 PM EST by a video enabled telemedicine application and verified that I am speaking with the correct person using two identifiers.  Location: Patient: Home Provider: Clinic   I discussed the limitations of evaluation and management by telemedicine and the availability of in person appointments. The patient expressed understanding and agreed to proceed.  Follow Up Instructions:   I discussed the assessment and treatment plan with the patient. The patient was provided an opportunity to ask questions and all were answered. The patient agreed with the plan and demonstrated an understanding of the instructions.   The patient was advised to call back or seek an in-person evaluation if the symptoms worsen or if the condition fails to improve as anticipated.  I provided 19 minutes of non-face-to-face time during this encounter.  Reginia FORBES Bolster, PA    03/27/2024 2:00 PM Makhai Fulco  MRN:  [id number]  Chief Complaint:  Chief Complaint  Patient presents with   Follow-up   Medication Refill   HPI:   Patrick Alexander is a 38 year old, Caucasian male with a past psychiatric history significant for attention deficit hyperactivity disorder (combined type), alcohol  use disorder (severe/dependence, in early remission), generalized anxiety disorder, chronic PTSD, and major depressive disorder who presents to [facility] via virtual video visit for follow-up and medication management.  Patient is currently being managed on the following psychiatric medications:  Sertraline  50 mg daily Strattera  40 mg 2 times daily Bupropion  (Wellbutrin  XL) 300 mg 24-hour tablet daily Clonidine  0.05 mg daily (half tablet) Buspirone  5 mg 2 times daily  Patient presents to the encounter stating that he has been doing well since the reintroduction of Zoloft  to his  medication regimen.  He reports that he has been taking his medications regularly and denies experiencing any adverse side effects.  He denies overt depressive symptoms and states that his anxiety has been minimal.  He notes that he has not been worrying about things that he should not be worried about.  He reports that the holidays would normally be a stressor for him; however, since taking Zoloft , he reports that he is looking forward to the holidays.  A PHQ-2 screen was performed with the patient scoring a 0.  A GAD-7 screen was also performed the patient scoring a 2.  Patient is alert and oriented x 4, pleasant, calm, cooperative, and fully engaged in conversation during the encounter.  Patient describes his mood as excited.  Patient exhibits euthymic mood with appropriate affect.  Patient denies suicidal or homicidal ideations.  He further denies auditory or visual hallucinations and does not appear to be responding to internal/external stimuli.  Patient endorses good sleep and receives on average 6 to 8 hours of sleep per night.  Patient endorses good appetite and eats on average 4 meals per day.  Patient endorses alcohol  consumption stating that he last had a half a glass of wine 3 weeks ago.  Patient endorses tobacco use and smokes on average 1 cigarette/day.  Patient denies illicit drug use.  Visit Diagnosis:    ICD-10-CM   1. Attention deficit hyperactivity disorder (ADHD), combined type  F90.2 cloNIDine  (CATAPRES ) 0.1 MG tablet    atomoxetine  (STRATTERA ) 40 MG capsule    buPROPion  (WELLBUTRIN  XL) 300 MG 24 hr tablet    2. Generalized anxiety disorder  F41.1 cloNIDine  (CATAPRES ) 0.1 MG tablet  sertraline  (ZOLOFT ) 50 MG tablet    busPIRone  (BUSPAR ) 5 MG tablet    3. Moderate episode of recurrent major depressive disorder (HCC)  F33.1 sertraline  (ZOLOFT ) 50 MG tablet    buPROPion  (WELLBUTRIN  XL) 300 MG 24 hr tablet       Past Psychiatric History:  Patient has a past Patrick history  significant for attention deficit hyperactivity disorder, major depressive disorder, PTSD, polysubstance abuse, and generalized anxiety disorder   Patient reports that he has a past history of hospitalization due to mental health.  Patient reports that the hospitalization occurred around the time he was 60 when he had moved to [place].  Patient reports that he is originally from [place] [address] town and once he moved to [place], he wanted to leave and by experimenting with drugs.  Patient states that he grew up sheltered and once he moved to [place], he states that he rebelled against the environment he grew up in.   Patient denies a past history of suicide attempt but states that he has had suicidal thoughts in the past   Patient denies a past history of homicide  Past Medical History:  Past Medical History:  Diagnosis Date   ADD (attention deficit disorder)    Allergy    Annual physical exam 02/14/2024   Anxiety    Depression    Hemorrhoids 02/03/2015   High risk sexual behavior 02/03/2015   History of admission to inpatient psychiatry department 03/09/2022   History of non-suicidal self-harm 03/09/2022   Hypertension    Sleep apnea    Sleep disorder 06/02/2015   Substance abuse (HCC)    Tobacco use disorder 06/02/2015    Past Surgical History:  Procedure Laterality Date   TONSILLECTOMY     TYMPANOSTOMY TUBE PLACEMENT     WISDOM TOOTH EXTRACTION      Family Psychiatric History:  Patient reports that mental health runs on his father's side of the family.  Patient reports that PTSD and generalized anxiety are common on his father's side of the family   Father - Generalized anxiety disorder and major depressive disorder Grandfather (paternal) - Generalized anxiety disorder and major depressive disorder.  Patient reports that his grandfather had a past psychiatric history of paranoid schizophrenia and spent extended periods of time in the hospital   Family history of suicide  attempt: Patient denies Family history of homicide: Patient denies Family history of substance abuse: Patient reports that his cousins on his mother's side of the family all struggle with substance abuse  Family History:  Family History  Problem Relation Age of Onset   Allergies Mother    Hyperlipidemia Mother    Diabetes Mother    Cancer Mother        Endometrial   Arthritis Mother    Varicose Veins Mother    Allergies Father    Hypertension Father    Stroke Father    Depression Father    Hyperlipidemia Father    Anxiety disorder Father    Arthritis Father    Cancer Father        Skin, lymphoma   Heart disease Father    Vision loss Father    Allergies Sister    Hyperlipidemia Sister    ADD / ADHD Sister    Varicose Veins Sister    Alcohol  abuse Maternal Aunt    Cancer Maternal Aunt        Skin   Miscarriages / Stillbirths Maternal Aunt    Arthritis Maternal Grandmother  Cancer Maternal Grandmother        Glioblastoma   Cancer Maternal Grandfather        Prostate   Cancer Paternal Grandmother        Breast   Dementia Paternal Grandmother    Anxiety disorder Paternal Grandfather    Depression Paternal Grandfather    Heart disease Paternal Grandfather    Hypertension Paternal Grandfather    Schizophrenia Paternal Grandfather     Social History:  Social History   Socioeconomic History   Marital status: Single    Spouse name: Not on file   Number of children: 0   Years of education: Not on file   Highest education level: Associate degree: occupational, scientist, product/process development, or vocational program  Occupational History   Occupation: Self-employed    Comment: Variety of jobs when available.  Tobacco Use   Smoking status: Every Day    Current packs/day: 0.00    Average packs/day: 0.3 packs/day for 17.0 years (4.3 ttl pk-yrs)    Types: Cigarettes    Last attempt to quit: 01/30/2015    Years since quitting: 9.1   Smokeless tobacco: Not on file   Tobacco comments:     About 2/day  Vaping Use   Vaping status: Never Used  Substance and Sexual Activity   Alcohol  use: Not Currently   Drug use: Not Currently    Types: Amphetamines, Benzodiazepines, Cocaine, Codeine, Hydrocodone, Marijuana, MDMA (Ecstacy), Methylphenidate, Oxycodone, Psilocybin   Sexual activity: Not Currently    Birth control/protection: None  Other Topics Concern   Not on file  Social History Narrative   Not on file   Social Drivers of Health   Financial Resource Strain: Low Risk  (02/13/2024)   Overall Financial Resource Strain (CARDIA)    Difficulty of Paying Living Expenses: Not very hard  Food Insecurity: No Food Insecurity (02/13/2024)   Hunger Vital Sign    Worried About Running Out of Food in the Last Year: Never true    Ran Out of Food in the Last Year: Never true  Transportation Needs: No Transportation Needs (02/13/2024)   PRAPARE - Administrator, Civil Service (Medical): No    Lack of Transportation (Non-Medical): No  Physical Activity: Sufficiently Active (02/13/2024)   Exercise Vital Sign    Days of Exercise per Week: 3 days    Minutes of Exercise per Session: 60 min  Stress: Stress Concern Present (02/13/2024)   [employer] of Occupational Health - Occupational Stress Questionnaire    Feeling of Stress: To some extent  Social Connections: Unknown (02/13/2024)   Social Connection and Isolation Panel    Frequency of Communication with Friends and Family: Three times a week    Frequency of Social Gatherings with Friends and Family: Once a week    Attends Religious Services: Patient declined    Database Administrator or Organizations: No    Attends Engineer, Structural: Not on file    Marital Status: Patient declined    Allergies:  Allergies  Allergen Reactions   Amoxicillin Nausea And Vomiting   Penicillins Nausea And Vomiting    Metabolic Disorder Labs: Lab Results  Component Value Date   HGBA1C 5.3 03/09/2022   MPG 105.41  03/09/2022   No results found for: PROLACTIN Lab Results  Component Value Date   CHOL 192 03/09/2022   TRIG 104 03/09/2022   HDL 73 03/09/2022   CHOLHDL 2.6 03/09/2022   VLDL 21 03/09/2022   LDLCALC 98 03/09/2022  LDLCALC 120 04/23/2015   Lab Results  Component Value Date   TSH 2.08 10/04/2023   TSH 2.539 03/09/2022    Therapeutic Level Labs: No results found for: LITHIUM No results found for: VALPROATE No results found for: CBMZ  Current Medications: Current Outpatient Medications  Medication Sig Dispense Refill   acetaminophen  (TYLENOL ) 325 MG tablet Take 650 mg by mouth every 6 (six) hours as needed.     atomoxetine  (STRATTERA ) 40 MG capsule Take 1 capsule (40 mg total) by mouth 2 (two) times daily with a meal. 60 capsule 2   buPROPion  (WELLBUTRIN  XL) 300 MG 24 hr tablet Take 1 tablet (300 mg total) by mouth every morning. 30 tablet 2   busPIRone  (BUSPAR ) 5 MG tablet Take 1 tablet (5 mg total) by mouth 2 (two) times daily. 60 tablet 2   cetirizine  (ZYRTEC  ALLERGY) 10 MG tablet Take 1 tablet (10 mg total) by mouth daily. 30 tablet 1   cloNIDine  (CATAPRES ) 0.1 MG tablet Take 0.5 tablets (0.05 mg total) by mouth daily. 15 tablet 2   loratadine (CLARITIN) 10 MG tablet Take 10 mg by mouth daily as needed for allergies.     Melatonin 10 MG TABS Take 10 mg by mouth at bedtime as needed. 30 tablet 3   olmesartan  (BENICAR ) 20 MG tablet Take 1 tablet (20 mg total) by mouth daily. 90 tablet 3   sertraline  (ZOLOFT ) 50 MG tablet Take 1 tablet (50 mg total) by mouth daily. 30 tablet 2   No current facility-administered medications for this visit.     Musculoskeletal: Strength & Muscle Tone: within normal limits Gait & Station: normal Patient leans: N/A  Psychiatric Specialty Exam: Review of Systems  Psychiatric/Behavioral:  Negative for decreased concentration, dysphoric mood, hallucinations, self-injury, sleep disturbance and suicidal ideas. The patient is not  nervous/anxious and is not hyperactive.     There were no vitals taken for this visit.There is no height or weight on file to calculate BMI.  General Appearance: Casual  Eye Contact:  Good  Speech:  Clear and Coherent and Normal Rate  Volume:  Normal  Mood:  Euthymic  Affect:  Appropriate  Thought Process:  Coherent, Goal Directed, and Descriptions of Associations: Intact  Orientation:  Full (Time, Place, and Person)  Thought Content: WDL   Suicidal Thoughts:  No  Homicidal Thoughts:  No  Memory:  Immediate;   Good Recent;   Good Remote;   Good  Judgement:  Good  Insight:  Good  Psychomotor Activity:  Normal  Concentration:  Concentration: Good and Attention Span: Good  Recall:  Good  Fund of Knowledge: Good  Language: Good  Akathisia:  No  Handed:  Right  AIMS (if indicated): not done  Assets:  Communication Skills Desire for Improvement Housing Resilience Social Support Talents/Skills Vocational/Educational  ADL's:  Intact  Cognition: WNL  Sleep:  Good   Screenings: AIMS    Flowsheet Row Clinical Support from 09/28/2023 in [facility]  AIMS Total Score 0   AUDIT    Flowsheet Row Office Visit from 10/04/2023 in [facility]  Alcohol  Use Disorder Identification Test Final Score (AUDIT) 13    GAD-7    Flowsheet Row Video Visit from 03/27/2024 in [facility] Most recent reading at 03/27/2024  2:13 PM Clinical Support from 02/14/2024 in [facility] Most recent reading at 02/14/2024  2:00 PM Office Visit from 02/14/2024 in [facility] [employer] Most recent reading at 02/14/2024 10:05 AM Clinical Support from 12/28/2023 in [facility] Most recent reading at 12/28/2023  1:16 PM Clinical Support from 08/03/2023 in [facility] Most recent reading at 08/03/2023  1:14  PM  Total GAD-7 Score 2 6 4 2 10    PHQ2-9    Flowsheet Row Video Visit from 03/27/2024 in [facility] Most recent reading at 03/27/2024  2:12 PM Clinical Support from 02/14/2024 in [facility] Most recent reading at 02/14/2024  2:00 PM Office Visit from 02/14/2024 in [facility] [place] Most recent reading at 02/14/2024 10:05 AM Clinical Support from 12/28/2023 in [facility] Most recent reading at 12/28/2023  1:15 PM Clinical Support from 09/28/2023 in [facility] Most recent reading at 09/28/2023  1:22 PM  PHQ-2 Total Score 0 0 0 0 0  PHQ-9 Total Score -- -- 0 -- 1   Flowsheet Row Video Visit from 03/27/2024 in [facility] Clinical Support from 02/14/2024 in [facility] Clinical Support from 12/28/2023 in [facility]  C-SSRS RISK CATEGORY Moderate Risk Moderate Risk Moderate Risk     Assessment and Plan:   Patrick Alexander is a 38 year old, Caucasian male with a past psychiatric history significant for attention deficit hyperactivity disorder (combined type), alcohol  use disorder (severe/dependence, in early remission), generalized anxiety disorder, chronic PTSD, and major depressive disorder who presents to [facility] for follow-up and medication management.  Patient presents to the encounter stating that he continues to take his medications regularly and denies experiencing any adverse side effects.  Since the reintroduction of Zoloft , patient reports that his mood has been generally well.  Patient denies overt depressive symptoms and states that his anxiety has been minimal.  A PHQ-2 screen was performed with the patient scoring a 0.  A GAD-7 screen was also performed with the patient scoring a 2.  Patient endorses stability  on his current medication regimen and would like to continue taking his medications as prescribed.  Patient's medication to be e-prescribed to pharmacy of choice.  A [place] Suicide Severity Rating Scale was performed with the patient being considered moderate risk.  Patient denies suicidal ideations and is able to contract for safety following the conclusion of the encounter.  Safety planning was discussed with the patient prior to the conclusion of the encounter: - Patient was instructed to contact 911 in the event of a mental health crisis - Patient was instructed to contact [facility] in the event of a mental health crisis - Patient was instructed to present to [facility] in the event of a mental health crisis  Collaboration of Care: Collaboration of Care: Medication Management AEB provider managing patient's psychiatric medications and Psychiatrist AEB patient being followed by mental health provider at this facility  Patient/Guardian was advised Release of Information must be obtained prior to any record release in order to collaborate their care with an outside provider. Patient/Guardian was advised if they have not already done so to contact the registration department to sign all necessary forms in order for us  to release information regarding their care.   Consent: Patient/Guardian gives verbal consent for treatment and assignment of benefits for services provided during this visit. Patient/Guardian expressed understanding and agreed to proceed.   1. Attention  deficit hyperactivity disorder (ADHD), combined type  - cloNIDine  (CATAPRES ) 0.1 MG tablet; Take 0.5 tablets (0.05 mg total) by mouth daily.  Dispense: 15 tablet; Refill: 2 - atomoxetine  (STRATTERA ) 40 MG capsule; Take 1 capsule (40 mg total) by mouth 2 (two) times daily with a meal.  Dispense: 60 capsule; Refill: 2 - buPROPion  (WELLBUTRIN  XL) 300 MG 24 hr tablet; Take 1 tablet  (300 mg total) by mouth every morning.  Dispense: 30 tablet; Refill: 2  2. Generalized anxiety disorder  - cloNIDine  (CATAPRES ) 0.1 MG tablet; Take 0.5 tablets (0.05 mg total) by mouth daily.  Dispense: 15 tablet; Refill: 2 - sertraline  (ZOLOFT ) 50 MG tablet; Take 1 tablet (50 mg total) by mouth daily.  Dispense: 30 tablet; Refill: 2 - busPIRone  (BUSPAR ) 5 MG tablet; Take 1 tablet (5 mg total) by mouth 2 (two) times daily.  Dispense: 60 tablet; Refill: 2  3. Moderate episode of recurrent major depressive disorder (HCC)  - sertraline  (ZOLOFT ) 50 MG tablet; Take 1 tablet (50 mg total) by mouth daily.  Dispense: 30 tablet; Refill: 2 - buPROPion  (WELLBUTRIN  XL) 300 MG 24 hr tablet; Take 1 tablet (300 mg total) by mouth every morning.  Dispense: 30 tablet; Refill: 2  Patient to follow-up in 2 months Provider spent a total of 15 minutes with the patient/reviewing patient's chart  Reginia FORBES Bolster, PA 03/27/2024, 2:00 PM

## 2024-04-04 ENCOUNTER — Other Ambulatory Visit: Payer: Self-pay

## 2024-04-11 ENCOUNTER — Other Ambulatory Visit: Payer: Self-pay

## 2024-05-07 ENCOUNTER — Other Ambulatory Visit: Payer: Self-pay

## 2024-05-09 ENCOUNTER — Other Ambulatory Visit: Payer: Self-pay

## 2024-05-29 ENCOUNTER — Encounter (HOSPITAL_COMMUNITY): Payer: MEDICAID | Admitting: Physician Assistant

## 2024-06-15 ENCOUNTER — Ambulatory Visit: Payer: MEDICAID | Admitting: Family Medicine
